# Patient Record
Sex: Female | Born: 1980 | Race: White | Hispanic: No | Marital: Single | Smoking: Former smoker
Health system: Southern US, Community
[De-identification: ages and names within clinical notes are randomized; demographics above are authoritative.]

## PROBLEM LIST (undated history)

## (undated) DIAGNOSIS — E282 Polycystic ovarian syndrome: Secondary | ICD-10-CM

## (undated) DIAGNOSIS — K5190 Ulcerative colitis, unspecified, without complications: Secondary | ICD-10-CM

## (undated) DIAGNOSIS — E242 Drug-induced Cushing's syndrome: Secondary | ICD-10-CM

## (undated) DIAGNOSIS — F32A Depression, unspecified: Secondary | ICD-10-CM

## (undated) DIAGNOSIS — F329 Major depressive disorder, single episode, unspecified: Secondary | ICD-10-CM

## (undated) DIAGNOSIS — I10 Essential (primary) hypertension: Secondary | ICD-10-CM

## (undated) DIAGNOSIS — I499 Cardiac arrhythmia, unspecified: Secondary | ICD-10-CM

## (undated) DIAGNOSIS — IMO0001 Reserved for inherently not codable concepts without codable children: Secondary | ICD-10-CM

## (undated) DIAGNOSIS — G4733 Obstructive sleep apnea (adult) (pediatric): Secondary | ICD-10-CM

## (undated) HISTORY — DX: Major depressive disorder, single episode, unspecified: F32.9

## (undated) HISTORY — DX: Depression, unspecified: F32.A

## (undated) HISTORY — DX: Drug-induced Cushing's syndrome: E24.2

## (undated) HISTORY — PX: WISDOM TOOTH EXTRACTION: SHX21

## (undated) HISTORY — DX: Reserved for inherently not codable concepts without codable children: IMO0001

## (undated) HISTORY — DX: Obstructive sleep apnea (adult) (pediatric): G47.33

---

## 2005-12-09 ENCOUNTER — Emergency Department (HOSPITAL_COMMUNITY): Admission: EM | Admit: 2005-12-09 | Discharge: 2005-12-09 | Payer: Self-pay | Admitting: Emergency Medicine

## 2011-07-20 ENCOUNTER — Inpatient Hospital Stay (INDEPENDENT_AMBULATORY_CARE_PROVIDER_SITE_OTHER)
Admission: RE | Admit: 2011-07-20 | Discharge: 2011-07-20 | Disposition: A | Discharge status: Home / Self Care | Payer: Self-pay | Source: Ambulatory Visit | Attending: Family Medicine | Admitting: Family Medicine

## 2011-07-20 DIAGNOSIS — N390 Urinary tract infection, site not specified: Secondary | ICD-10-CM

## 2011-07-20 LAB — POCT URINALYSIS DIP (DEVICE)
Glucose, UA: NEGATIVE mg/dL
Nitrite: NEGATIVE
Urobilinogen, UA: 0.2 mg/dL (ref 0.0–1.0)

## 2011-11-06 ENCOUNTER — Other Ambulatory Visit: Payer: Self-pay

## 2011-11-06 ENCOUNTER — Emergency Department (INDEPENDENT_AMBULATORY_CARE_PROVIDER_SITE_OTHER)
Admission: EM | Admit: 2011-11-06 | Discharge: 2011-11-06 | Disposition: A | Discharge status: Home / Self Care | Payer: Self-pay | Source: Home / Self Care | Attending: Family Medicine | Admitting: Family Medicine

## 2011-11-06 DIAGNOSIS — R002 Palpitations: Secondary | ICD-10-CM

## 2011-11-06 LAB — POCT I-STAT, CHEM 8
Potassium: 3.6 mEq/L (ref 3.5–5.1)
Sodium: 142 mEq/L (ref 135–145)

## 2011-11-06 NOTE — ED Provider Notes (Signed)
History     CSN: [id number] Arrival date & time: 11/06/2011  1:39 PM   First MD Initiated Contact with Patient 11/06/11 1343      Chief Complaint  Patient presents with  . Tachycardia    (Consider location/radiation/quality/duration/timing/severity/associated sxs/prior treatment) Patient is a 30 y.o. female presenting with palpitations.  Palpitations  This is a new problem. The current episode started more than 1 week ago. The problem occurs every several days. 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 Pertinent negatives include no fever, no chest pain, no chest pressure, no near-syncope, no dizziness and no weakness. Associated symptoms comments: Does freq [employer] drinks.. Risk factors include stress.    History reviewed. No pertinent past medical history.  History reviewed. No pertinent past surgical history.  History reviewed. No pertinent family history.  History  Substance Use Topics  . Smoking status: Current Everyday Smoker  . Smokeless tobacco: Not on file  . Alcohol Use: Yes    OB History    Grav Para Term Preterm Abortions TAB SAB Ect Mult Living                  Review of Systems  Constitutional: Negative.  Negative for fever.  HENT: Negative.   Respiratory: Negative.   Cardiovascular: Positive for palpitations. Negative for chest pain and near-syncope.  Neurological: Negative for dizziness and weakness.    Allergies  Chocolate; Latex; and Nickel  Home Medications  No current outpatient prescriptions on file.  BP 136/84  Pulse 77  Temp(Src) 98.8 F (37.1 C) (Oral)  Resp 12  SpO2 100%  Physical Exam  Nursing note and vitals reviewed. Constitutional: She appears well-developed and well-nourished.  HENT:  Head: Normocephalic.  Right Ear: External ear normal.  Left Ear: External ear normal.  Neck: Normal range of motion. Neck supple.    Cardiovascular: Normal rate, regular rhythm, normal heart sounds and intact distal pulses.   Pulmonary/Chest: Effort normal and breath sounds normal.  Musculoskeletal: She exhibits no edema.  Skin: Skin is warm and dry.    ED Course  Procedures (including critical care time)  Labs Reviewed  POCT I-STAT, CHEM 8 - Abnormal; Notable for the following:    Glucose, Bld 105 (*)    All other components within normal limits  I-STAT, CHEM 8   No results found.   1. Palpitations       MDM  ekg--wnl., i-stat wnl.        Barkley Bruns, MD 11/06/11 [phone]

## 2011-11-06 NOTE — ED Notes (Signed)
Reportedly has been having episodes of rapid HR for past couple of weeks, heart rate speeds up and slows down; sometimes her heart "stops" or skips ; feels as if her heart is going to "explode'; denies pain otherwise

## 2011-12-12 ENCOUNTER — Inpatient Hospital Stay (HOSPITAL_COMMUNITY): Payer: Self-pay

## 2011-12-12 ENCOUNTER — Inpatient Hospital Stay (HOSPITAL_COMMUNITY)
Admission: AD | Admit: 2011-12-12 | Discharge: 2011-12-12 | Disposition: A | Discharge status: Home / Self Care | Payer: Self-pay | Source: Ambulatory Visit | Attending: Obstetrics & Gynecology | Admitting: Obstetrics & Gynecology

## 2011-12-12 ENCOUNTER — Encounter (HOSPITAL_COMMUNITY): Payer: Self-pay | Admitting: *Deleted

## 2011-12-12 DIAGNOSIS — N939 Abnormal uterine and vaginal bleeding, unspecified: Secondary | ICD-10-CM

## 2011-12-12 DIAGNOSIS — N938 Other specified abnormal uterine and vaginal bleeding: Secondary | ICD-10-CM | POA: Insufficient documentation

## 2011-12-12 DIAGNOSIS — N949 Unspecified condition associated with female genital organs and menstrual cycle: Secondary | ICD-10-CM | POA: Insufficient documentation

## 2011-12-12 DIAGNOSIS — N898 Other specified noninflammatory disorders of vagina: Secondary | ICD-10-CM

## 2011-12-12 HISTORY — DX: Cardiac arrhythmia, unspecified: I49.9

## 2011-12-12 LAB — WET PREP, GENITAL
Clue Cells Wet Prep HPF POC: NONE SEEN
Trich, Wet Prep: NONE SEEN
Yeast Wet Prep HPF POC: NONE SEEN

## 2011-12-12 LAB — URINALYSIS, ROUTINE W REFLEX MICROSCOPIC
Glucose, UA: NEGATIVE mg/dL
Specific Gravity, Urine: >1.03 — ABNORMAL HIGH (ref 1.005–1.030)

## 2011-12-12 LAB — URINE MICROSCOPIC-ADD ON

## 2011-12-12 LAB — CBC
HCT: 34.8 % — ABNORMAL LOW (ref 36.0–46.0)
MCV: 86.4 fL (ref 78.0–100.0)
RBC: 4.03 MIL/uL (ref 3.87–5.11)
RDW: 11.9 % (ref 11.5–15.5)
WBC: 7 10*3/uL (ref 4.0–10.5)

## 2011-12-12 NOTE — ED Provider Notes (Signed)
History   Patient is a 31 year old G0P0 who presents with chief complaint of heavy vaginal bleeding, vaginal pruritus, and lower abdominal and low back pain for the past 13 days.  She states that her vaginal bleeding was irregular and heavy, filling 8 tampons per day.  Her LMP was December 14th and she menstruates regularly every 30 days.  She has no previous history of irregular vaginal bleeding.  She is not currently bleeding today.  Patient also reports vaginal pruritus that has been intermittent over the past 13 days and lower abdominal/low back pain that has been persistent for the past 13 days, as well as painful vaginal intercourse.  States that she had 3 UTI's between August and November 2012.  She has never had a pap smear and has no previous history of STI's.  Reports tactile fever, fatigue, and headache 1 day ago. Denies vision change, shortness of breath, chest pain, vomiting, diarrhea, blood in stool, leg cramping.  Quit smoking 11/28/2011 and takes a multivitamin daily.     Chief Complaint  Patient presents with  . Abdominal Cramping  . Back Pain   HPI  Pertinent Gynecological History: Menses: usually lasting less than 6 days and with minimal cramping Bleeding: intermenstrual bleeding Contraception: vasectomy DES exposure: unknown Blood transfusions: none Sexually transmitted diseases: no past history Previous GYN Procedures: n/a Last mammogram: n/a Last pap: Never had a pap smear.  Past Medical History  Diagnosis Date  . Irregular heart beat     Past Surgical History  Procedure Date  . Wisdom tooth extraction     Family History  Problem Relation Age of Onset  . Anesthesia problems Neg Hx     History  Substance Use Topics  . Smoking status: Former Smoker    Quit date: 11/28/2011  . Smokeless tobacco: Never Used  . Alcohol Use: Yes     occas., once a month or less.    Allergies:  Allergies  Allergen Reactions  . Chocolate Swelling  . Latex Itching and  Swelling  . Nickel Swelling    Prescriptions prior to admission  Medication Sig Dispense Refill  . acetaminophen (TYLENOL) 500 MG tablet Take 1,000 mg by mouth every 6 (six) hours as needed. Takes for headache      . Multiple Vitamin (MULITIVITAMIN WITH MINERALS) TABS Take 1 tablet by mouth daily.        Review of Systems  Constitutional: Positive for fever (tactile) and malaise/fatigue.  Eyes: Negative for blurred vision and double vision.  Respiratory: Negative for shortness of breath.   Cardiovascular: Negative for chest pain and leg swelling.  Gastrointestinal: Positive for nausea and abdominal pain. Negative for diarrhea and blood in stool.  Genitourinary: Positive for hematuria. Negative for dysuria.  Neurological: Positive for headaches. Negative for loss of consciousness.   Physical Exam   Blood pressure 135/84, pulse 108, temperature 99 F (37.2 C), temperature source Oral, resp. rate 16, height 5' 4.5" (1.638 m), weight 69.854 kg (154 lb), last menstrual period 11/29/2011, SpO2 99.00%.  Physical Exam  Constitutional: She is oriented to person, place, and time. She appears well-developed and well-nourished.  HENT:  Head: Normocephalic and atraumatic.  Eyes: Conjunctivae are normal. Pupils are equal, round, and reactive to light.  Cardiovascular: Regular rhythm, normal heart sounds and intact distal pulses.   No murmur heard. Respiratory: Effort normal and breath sounds normal. No respiratory distress. She has no wheezes. She has no rales.  GI: Bowel sounds are normal. There is tenderness (  periumbilical and RLQ). There is no rebound and no guarding.  Genitourinary: Vagina normal and uterus normal. There is no rash, lesion or injury on the right labia. There is no rash, lesion or injury on the left labia. Cervix exhibits no discharge. Right adnexum displays tenderness (mild to moderate). Right adnexum displays no mass and no fullness. Left adnexum displays tenderness (mild to  moderate). Left adnexum displays no mass and no fullness. No bleeding around the vagina. No vaginal discharge found.  Neurological: She is alert and oriented to person, place, and time.  Skin: Skin is warm and dry.  Psychiatric: She has a normal mood and affect. Her behavior is normal.   Results for orders placed during the hospital encounter of 12/12/11 (from the past 24 hour(s))  URINALYSIS, ROUTINE W REFLEX MICROSCOPIC     Status: Abnormal   Collection Time   12/12/11 10:55 AM      Component Value Range   Color, Urine YELLOW  YELLOW    APPearance HAZY (*) CLEAR    Specific Gravity, Urine >1.030 (*) 1.005 - 1.030    pH 6.0  5.0 - 8.0    Glucose, UA NEGATIVE  NEGATIVE (mg/dL)   Hgb urine dipstick SMALL (*) NEGATIVE    Bilirubin Urine NEGATIVE  NEGATIVE    Ketones, ur NEGATIVE  NEGATIVE (mg/dL)   Protein, ur NEGATIVE  NEGATIVE (mg/dL)   Urobilinogen, UA 0.2  0.0 - 1.0 (mg/dL)   Nitrite NEGATIVE  NEGATIVE    Leukocytes, UA LARGE (*) NEGATIVE   URINE MICROSCOPIC-ADD ON     Status: Normal   Collection Time   12/12/11 10:55 AM      Component Value Range   Squamous Epithelial / LPF RARE  RARE    WBC, UA TOO NUMEROUS TO COUNT  <3 (WBC/hpf)   RBC / HPF 3-6  <3 (RBC/hpf)  POCT PREGNANCY, URINE     Status: Normal   Collection Time   12/12/11 11:07 AM      Component Value Range   Preg Test, Ur NEGATIVE    WET PREP, GENITAL     Status: Abnormal   Collection Time   12/12/11 11:40 AM      Component Value Range   Yeast, Wet Prep NONE SEEN  NONE SEEN    Trich, Wet Prep NONE SEEN  NONE SEEN    Clue Cells, Wet Prep NONE SEEN  NONE SEEN    WBC, Wet Prep HPF POC MODERATE (*) NONE SEEN   CBC     Status: Abnormal   Collection Time   12/12/11 12:10 PM      Component Value Range   WBC 7.0  4.0 - 10.5 (K/uL)   RBC 4.03  3.87 - 5.11 (MIL/uL)   Hemoglobin 11.6 (*) 12.0 - 15.0 (g/dL)   HCT 16.1 (*) 09.6 - 46.0 (%)   MCV 86.4  78.0 - 100.0 (fL)   MCH 28.8  26.0 - 34.0 (pg)   MCHC 33.3  30.0 - 36.0  (g/dL)   RDW 04.5  40.9 - 81.1 (%)   Platelets 269  150 - 400 (K/uL)    *RADIOLOGY REPORT*  Clinical Data: Pelvic pain  TRANSABDOMINAL AND TRANSVAGINAL ULTRASOUND OF PELVIS  Technique: Both transabdominal and transvaginal ultrasound  examinations of the pelvis were performed. Transabdominal technique  was performed for global imaging of the pelvis including uterus,  ovaries, adnexal regions, and pelvic cul-de-sac.  Comparison: None.  It was necessary to proceed with endovaginal exam following the  transabdominal exam to visualize the endometrium and adnexa.  Findings:  The uterus is normal in size, measuring 6.6 x 3.2 x 4.5 cm. The  endometrial stripe is thin and homogeneous, measuring 5 mm in  width. No focal fibroids are identified.  Both ovaries are normal in size and appearance. The right ovary  measures 3.0 x 2.5 x 2.6 cm, and the left ovary measures 4.8 x 4.1  x 2.7 cm. Physiologic follicles are present on both ovaries.  There is a moderate amount of complex free pelvic fluid. This  could represent bleeding or infection. No dilated fallopian tubes  are identified today.  IMPRESSION:  Normal appearing uterus and ovaries bilaterally.  There is a moderate amount of complex fluid within the pelvis which  most likely represents physiologic mid cycle bleeding from  ovulation or infection. Please correlate clinically regarding any  other symptoms of pelvic inflammatory disease.  Original Report Authenticated By: Brandon Melnick, M.D.  MAU Course  Procedures Urinalysis UPT Gonorrhea and chlamydia Wet prep Pelvic ultrasound   MDM  I have reviewed the patient's history, observed the student's physical exam and discussed plan of care with him.  I agree with his findings. Assessment and Plan  Assessment 1) Abnormal vaginal bleeding Plan 1) Reviewed labs and ultrasound with patient.  If symptoms continue she will need evaluation with the doctor of her choice.    Derek Jack 12/12/2011, 11:47 AM    Matt Holmes, NP 12/12/11 1439  Matt Holmes, NP 12/12/11 1444

## 2011-12-12 NOTE — Progress Notes (Signed)
Patient states she has had irregular bleeding. Last period was 1-2. Has had recurrent UTI's since September and yeast infections.

## 2011-12-13 LAB — GC/CHLAMYDIA PROBE AMP, GENITAL: GC Probe Amp, Genital: NEGATIVE

## 2012-01-25 ENCOUNTER — Encounter: Payer: Self-pay | Admitting: Obstetrics & Gynecology

## 2013-03-18 ENCOUNTER — Other Ambulatory Visit: Payer: Self-pay | Admitting: Gastroenterology

## 2013-03-18 DIAGNOSIS — R1011 Right upper quadrant pain: Secondary | ICD-10-CM

## 2013-03-19 ENCOUNTER — Ambulatory Visit
Admission: RE | Admit: 2013-03-19 | Discharge: 2013-03-19 | Disposition: A | Discharge status: Home / Self Care | Payer: BC Managed Care – PPO | Source: Ambulatory Visit | Attending: Gastroenterology | Admitting: Gastroenterology

## 2013-03-19 DIAGNOSIS — R1011 Right upper quadrant pain: Secondary | ICD-10-CM

## 2013-12-18 ENCOUNTER — Other Ambulatory Visit: Payer: Self-pay | Admitting: Gastroenterology

## 2013-12-18 ENCOUNTER — Ambulatory Visit
Admission: RE | Admit: 2013-12-18 | Discharge: 2013-12-18 | Disposition: A | Discharge status: Home / Self Care | Payer: BC Managed Care – PPO | Source: Ambulatory Visit | Attending: Gastroenterology | Admitting: Gastroenterology

## 2013-12-18 DIAGNOSIS — R109 Unspecified abdominal pain: Secondary | ICD-10-CM

## 2013-12-18 DIAGNOSIS — K5190 Ulcerative colitis, unspecified, without complications: Secondary | ICD-10-CM

## 2013-12-18 MED ORDER — IOHEXOL 300 MG/ML  SOLN
30.0000 mL | Freq: Once | INTRAMUSCULAR | Status: AC | PRN
  Administered 2013-12-18: 30 mL via ORAL

## 2013-12-18 MED ORDER — IOHEXOL 300 MG/ML  SOLN
100.0000 mL | Freq: Once | INTRAMUSCULAR | Status: AC | PRN
  Administered 2013-12-18: 100 mL via INTRAVENOUS

## 2013-12-20 ENCOUNTER — Inpatient Hospital Stay (HOSPITAL_COMMUNITY)
Admission: EM | Admit: 2013-12-20 | Discharge: 2013-12-23 | DRG: 387 | Disposition: A | Discharge status: Home / Self Care | Payer: BC Managed Care – PPO | Attending: Family Medicine | Admitting: Family Medicine

## 2013-12-20 ENCOUNTER — Encounter (HOSPITAL_COMMUNITY): Payer: Self-pay | Admitting: Emergency Medicine

## 2013-12-20 DIAGNOSIS — K5190 Ulcerative colitis, unspecified, without complications: Principal | ICD-10-CM | POA: Diagnosis present

## 2013-12-20 DIAGNOSIS — R002 Palpitations: Secondary | ICD-10-CM | POA: Diagnosis present

## 2013-12-20 DIAGNOSIS — Z79899 Other long term (current) drug therapy: Secondary | ICD-10-CM

## 2013-12-20 DIAGNOSIS — Z87891 Personal history of nicotine dependence: Secondary | ICD-10-CM

## 2013-12-20 DIAGNOSIS — Z23 Encounter for immunization: Secondary | ICD-10-CM

## 2013-12-20 DIAGNOSIS — Z833 Family history of diabetes mellitus: Secondary | ICD-10-CM

## 2013-12-20 DIAGNOSIS — K859 Acute pancreatitis without necrosis or infection, unspecified: Secondary | ICD-10-CM

## 2013-12-20 DIAGNOSIS — R7309 Other abnormal glucose: Secondary | ICD-10-CM | POA: Diagnosis present

## 2013-12-20 DIAGNOSIS — E282 Polycystic ovarian syndrome: Secondary | ICD-10-CM | POA: Diagnosis present

## 2013-12-20 DIAGNOSIS — R748 Abnormal levels of other serum enzymes: Secondary | ICD-10-CM | POA: Diagnosis present

## 2013-12-20 HISTORY — DX: Polycystic ovarian syndrome: E28.2

## 2013-12-20 HISTORY — DX: Ulcerative colitis, unspecified, without complications: K51.90

## 2013-12-20 LAB — CBC WITH DIFFERENTIAL/PLATELET
BASOS ABS: 0 10*3/uL (ref 0.0–0.1)
Basophils Relative: 0 % (ref 0–1)
Eosinophils Absolute: 0.4 10*3/uL (ref 0.0–0.7)
Eosinophils Relative: 4 % (ref 0–5)
HEMATOCRIT: 32.9 % — AB (ref 36.0–46.0)
Hemoglobin: 11.3 g/dL — ABNORMAL LOW (ref 12.0–15.0)
LYMPHS PCT: 17 % (ref 12–46)
Lymphs Abs: 1.5 10*3/uL (ref 0.7–4.0)
MCH: 27.9 pg (ref 26.0–34.0)
MCHC: 34.3 g/dL (ref 30.0–36.0)
MCV: 81.2 fL (ref 78.0–100.0)
MONO ABS: 0.9 10*3/uL (ref 0.1–1.0)
Monocytes Relative: 10 % (ref 3–12)
NEUTROS ABS: 6.1 10*3/uL (ref 1.7–7.7)
Neutrophils Relative %: 69 % (ref 43–77)
PLATELETS: 309 10*3/uL (ref 150–400)
RBC: 4.05 MIL/uL (ref 3.87–5.11)
RDW: 12.8 % (ref 11.5–15.5)
WBC: 8.9 10*3/uL (ref 4.0–10.5)

## 2013-12-20 LAB — COMPREHENSIVE METABOLIC PANEL
ALT: 5 U/L (ref 0–35)
AST: 11 U/L (ref 0–37)
Albumin: 3.2 g/dL — ABNORMAL LOW (ref 3.5–5.2)
Alkaline Phosphatase: 66 U/L (ref 39–117)
BUN: 4 mg/dL — ABNORMAL LOW (ref 6–23)
CHLORIDE: 98 meq/L (ref 96–112)
CO2: 23 mEq/L (ref 19–32)
Calcium: 9.1 mg/dL (ref 8.4–10.5)
Creatinine, Ser: 0.67 mg/dL (ref 0.50–1.10)
GFR calc non Af Amer: >90 mL/min (ref >90)
Glucose, Bld: 79 mg/dL (ref 70–99)
Potassium: 4 mEq/L (ref 3.7–5.3)
SODIUM: 138 meq/L (ref 137–147)
Total Protein: 7.7 g/dL (ref 6.0–8.3)

## 2013-12-20 LAB — URINALYSIS, ROUTINE W REFLEX MICROSCOPIC
BILIRUBIN URINE: NEGATIVE
GLUCOSE, UA: NEGATIVE mg/dL
Hgb urine dipstick: NEGATIVE
KETONES UR: NEGATIVE mg/dL
Leukocytes, UA: NEGATIVE
Nitrite: NEGATIVE
Protein, ur: NEGATIVE mg/dL
Specific Gravity, Urine: 1.014 (ref 1.005–1.030)
Urobilinogen, UA: 0.2 mg/dL (ref 0.0–1.0)
pH: 6.5 (ref 5.0–8.0)

## 2013-12-20 LAB — GLUCOSE, CAPILLARY: GLUCOSE-CAPILLARY: 248 mg/dL — AB (ref 70–99)

## 2013-12-20 LAB — OCCULT BLOOD, POC DEVICE: Fecal Occult Bld: POSITIVE — AB

## 2013-12-20 LAB — AMYLASE: Amylase: 292 U/L — ABNORMAL HIGH (ref 0–105)

## 2013-12-20 LAB — PHOSPHORUS: Phosphorus: 2.3 mg/dL (ref 2.3–4.6)

## 2013-12-20 LAB — LIPASE, BLOOD: Lipase: 1005 U/L — ABNORMAL HIGH (ref 11–59)

## 2013-12-20 LAB — MAGNESIUM: MAGNESIUM: 1.8 mg/dL (ref 1.5–2.5)

## 2013-12-20 MED ORDER — METHYLPREDNISOLONE SODIUM SUCC 125 MG IJ SOLR
80.0000 mg | Freq: Three times a day (TID) | INTRAMUSCULAR | Status: DC
  Administered 2013-12-20 – 2013-12-22 (×5): 80 mg via INTRAVENOUS
  Filled 2013-12-20 (×8): qty 1.28

## 2013-12-20 MED ORDER — HEPARIN SODIUM (PORCINE) 5000 UNIT/ML IJ SOLN
5000.0000 [IU] | Freq: Three times a day (TID) | INTRAMUSCULAR | Status: DC
  Administered 2013-12-20 – 2013-12-23 (×9): 5000 [IU] via SUBCUTANEOUS
  Filled 2013-12-20 (×11): qty 1

## 2013-12-20 MED ORDER — ONDANSETRON HCL 4 MG/2ML IJ SOLN
4.0000 mg | Freq: Once | INTRAMUSCULAR | Status: AC
  Administered 2013-12-20: 4 mg via INTRAVENOUS
  Filled 2013-12-20: qty 2

## 2013-12-20 MED ORDER — METHYLPREDNISOLONE SODIUM SUCC 125 MG IJ SOLR
125.0000 mg | Freq: Once | INTRAMUSCULAR | Status: AC
  Administered 2013-12-20: 125 mg via INTRAVENOUS
  Filled 2013-12-20: qty 2

## 2013-12-20 MED ORDER — MORPHINE SULFATE 2 MG/ML IJ SOLN
1.0000 mg | INTRAMUSCULAR | Status: DC | PRN
  Administered 2013-12-20 – 2013-12-22 (×7): 1 mg via INTRAVENOUS
  Filled 2013-12-20 (×8): qty 1

## 2013-12-20 MED ORDER — HYDROMORPHONE HCL PF 1 MG/ML IJ SOLN
1.0000 mg | Freq: Once | INTRAMUSCULAR | Status: AC
  Administered 2013-12-20: 1 mg via INTRAVENOUS
  Filled 2013-12-20: qty 1

## 2013-12-20 MED ORDER — SODIUM CHLORIDE 0.9 % IV BOLUS (SEPSIS)
1000.0000 mL | Freq: Once | INTRAVENOUS | Status: AC
  Administered 2013-12-20: 1000 mL via INTRAVENOUS

## 2013-12-20 MED ORDER — CYCLOBENZAPRINE HCL 5 MG PO TABS
5.0000 mg | ORAL_TABLET | Freq: Every evening | ORAL | Status: DC | PRN
  Filled 2013-12-20: qty 2

## 2013-12-20 MED ORDER — ONDANSETRON HCL 4 MG PO TABS: 4.0000 mg | ORAL_TABLET | Freq: Four times a day (QID) | ORAL | Status: DC | PRN

## 2013-12-20 MED ORDER — LORATADINE 10 MG PO TABS
10.0000 mg | ORAL_TABLET | Freq: Every day | ORAL | Status: DC
  Administered 2013-12-20 – 2013-12-23 (×4): 10 mg via ORAL
  Filled 2013-12-20 (×6): qty 1

## 2013-12-20 MED ORDER — NORGESTIM-ETH ESTRAD TRIPHASIC 0.18/0.215/0.25 MG-35 MCG PO TABS
1.0000 | ORAL_TABLET | Freq: Every day | ORAL | Status: DC
  Administered 2013-12-20 – 2013-12-22 (×3): 1 via ORAL

## 2013-12-20 MED ORDER — INSULIN ASPART 100 UNIT/ML ~~LOC~~ SOLN
0.0000 [IU] | Freq: Three times a day (TID) | SUBCUTANEOUS | Status: DC
  Administered 2013-12-21 (×3): 2 [IU] via SUBCUTANEOUS
  Administered 2013-12-22 (×2): 1 [IU] via SUBCUTANEOUS
  Administered 2013-12-22: 2 [IU] via SUBCUTANEOUS
  Administered 2013-12-23: 5 [IU] via SUBCUTANEOUS

## 2013-12-20 MED ORDER — KCL IN DEXTROSE-NACL 20-5-0.45 MEQ/L-%-% IV SOLN
INTRAVENOUS | Status: DC
  Administered 2013-12-20: 75 mL via INTRAVENOUS
  Administered 2013-12-21 – 2013-12-22 (×4): via INTRAVENOUS
  Filled 2013-12-20 (×6): qty 1000

## 2013-12-20 MED ORDER — ONDANSETRON HCL 4 MG/2ML IJ SOLN
4.0000 mg | Freq: Four times a day (QID) | INTRAMUSCULAR | Status: DC | PRN
  Administered 2013-12-20: 4 mg via INTRAVENOUS
  Filled 2013-12-20: qty 2

## 2013-12-20 NOTE — ED Notes (Signed)
Pt from home reports that pt was dx with ulcerative colitis 6 months ago. Pt c/o severe pain x1 month. Pt reports that she was taking Voltaren and pain has become worse. Pt states that she has bloody stools x1 months. Pt reports HBG was 11 on 1/22 and was told if she continued to have blood in stool, to go to ED. Pt denies vomiting. Pt is A&O and in NAD

## 2013-12-20 NOTE — H&P (Addendum)
[facility] History and Physical  Kathleen AdaJennifer S Zacarias ZOX:[id number] DOB: 01/18/1981 DOA: 12/20/2013  Referring physician: Dr. Loretha StaplerWofford PCP: Per Patient No Pcp   Chief Complaint: abdominal discomfort, diarrhea, with BRBPR  HPI: Kathleen AdaJennifer S Mcdonald is a 33 y.o. female  With history of ulcerative colitis diagnosed 6 months ago. Presents to the ED with the above complaints. Reports problem has been present most recently within the last month. And since then she has had having bloody stools and abdominal discomfort. [facility]. Patient also states that she has been warned to the touch and having temperatures in the low 100's. The tramadol has helped with the abdominal discomfort but given persistent symptoms patient presented to the ED for further evaluation and recommendations.  Patient had a CT of abdomen which was interpreted as colonic wall thickening involving the sigmoid colon/rectum and hepatic flexure, corresponding to known ulcerative colitis, no evidence of bowel obstruction. Also patient had elevated lipase levels while in the ED although CT reports normal pancreas   Review of Systems:  Constitutional:  No weight loss, night sweats, Fevers, chills, fatigue.  HEENT:  No headaches, Difficulty swallowing,Tooth/dental problems,Sore throat,  No sneezing, itching, ear ache, nasal congestion, post nasal drip,  Cardio-vascular:  No chest pain, Orthopnea, PND, swelling in lower extremities, anasarca, dizziness, palpitations  GI:  No heartburn, indigestion, + abdominal pain,+ nausea, denies vomiting, + diarrhea,+ loss of appetite  Resp:  No shortness of breath with exertion or at rest. No excess mucus, no productive cough, No non-productive cough, No coughing up of blood.No change in color of mucus.No wheezing.No chest wall deformity  Skin:  no rash or lesions.  GU:  no dysuria, change in color of urine, no urgency or frequency. No flank pain.  Musculoskeletal:  No  joint pain or swelling. No decreased range of motion. No back pain.  Psych:  No change in mood or affect. No depression or anxiety. No memory loss.   Past Medical History  Diagnosis Date  . Irregular heart beat   . Ulcerative colitis   . PCOS (polycystic ovarian syndrome)    Past Surgical History  Procedure Laterality Date  . Wisdom tooth extraction     Social History:  reports that she quit smoking about 2 years ago. She has never used smokeless tobacco. She reports that she drinks alcohol. She reports that she does not use illicit drugs.  Allergies  Allergen Reactions  . Chocolate Swelling  . Latex Itching and Swelling  . Nickel Swelling    Family History  Problem Relation Age of Onset  . Anesthesia problems Neg Hx      Prior to Admission medications   Medication Sig Start Date End Date Taking? Authorizing Provider  cetirizine (ZYRTEC) 10 MG tablet Take 10 mg by mouth at bedtime.   Yes Historical Provider, MD  cyclobenzaprine (FLEXERIL) 5 MG tablet Take 5-10 mg by mouth at bedtime as needed for muscle spasms.   Yes Historical Provider, MD  mesalamine (LIALDA) 1.2 G EC tablet Take 2.4 g by mouth 2 (two) times daily.   Yes Historical Provider, MD  metFORMIN (GLUCOPHAGE) 500 MG tablet Take 500 mg by mouth 2 (two) times daily with a meal.   Yes Historical Provider, MD  Norgestim-Eth Estrad Triphasic (TRINESSA, 28, PO) Take 1 tablet by mouth daily.   Yes Historical Provider, MD  spironolactone (ALDACTONE) 100 MG tablet Take 100 mg by mouth daily.   Yes Historical Provider, MD  traMADol Janean Sark(ULTRAM) 50  MG tablet Take 50 mg by mouth every 6 (six) hours as needed for moderate pain.   Yes Historical Provider, MD   Physical Exam: Filed Vitals:   12/20/13 1217  BP: 144/87  Pulse: 122  Temp: 97.7 F (36.5 C)  Resp: 17    BP 144/87  Pulse 122  Temp(Src) 97.7 F (36.5 C) (Oral)  Resp 17  SpO2 99%  LMP 11/24/2013  General:  Appears calm and comfortable Eyes: PERRL, normal  lids, irises & conjunctiva ENT: grossly normal hearing, lips & tongue Neck: no LAD, masses or thyromegaly Cardiovascular: RRR, no m/r/g. No LE edema. Telemetry: SR, no arrhythmias  Respiratory: CTA bilaterally, no w/r/r. Normal respiratory effort. Abdomen: soft, nondistended, no hepatomegaly Skin: no rash or induration seen on limited exam Musculoskeletal: grossly normal tone BUE/BLE Psychiatric: grossly normal mood and affect, speech fluent and appropriate Neurologic: grossly non-focal.          Labs on Admission:  Basic Metabolic Panel:  Recent Labs Lab 12/20/13 1355  NA 138  K 4.0  CL 98  CO2 23  GLUCOSE 79  BUN 4*  CREATININE 0.67  CALCIUM 9.1   Liver Function Tests:  Recent Labs Lab 12/20/13 1355  AST 11  ALT 5  ALKPHOS 66  BILITOT <0.2*  PROT 7.7  ALBUMIN 3.2*    Recent Labs Lab 12/20/13 1355  LIPASE 1005*   No results found for this basename: AMMONIA,  in the last 168 hours CBC:  Recent Labs Lab 12/20/13 1355  WBC 8.9  NEUTROABS 6.1  HGB 11.3*  HCT 32.9*  MCV 81.2  PLT 309   Cardiac Enzymes: No results found for this basename: CKTOTAL, CKMB, CKMBINDEX, TROPONINI,  in the last 168 hours  BNP (last 3 results) No results found for this basename: PROBNP,  in the last 8760 hours CBG: No results found for this basename: GLUCAP,  in the last 168 hours  Radiological Exams on Admission: No results found.   Assessment/Plan Active Problems:   Ulcerative colitis, acute - Case discussed with gastroenterologist, I have officially consult with gastroenterology and currently awaiting their recommendations. Per my discussion with the GI doctor they currently recommend holding mesalamine and spironolactone. Also GI recommends adding Solu-Medrol 60 mg every 8 hours and a clear liquid diet  DVT prophylaxis - Heparin  Addendum: Elevated lipase: Will reassess amylase levels and hold spironolactone as well as mesalamine. Reassess lipase levels next a.m.  Place on clear liquid diet and maintenance IV fluids  Code Status: full Family Communication: Discussed with patient and mother at bedside Disposition Plan: Pending improvement in condition and per GI recommendation  Time spent: > 35 minutes  Penny Pia [facility] Pager [phone]

## 2013-12-20 NOTE — ED Provider Notes (Signed)
CSN: [id number]     Arrival date & time 12/20/13  1204 History   First MD Initiated Contact with Patient 12/20/13 1315     Chief Complaint  Patient presents with  . Abdominal Pain  . Rectal Bleeding   (Consider location/radiation/quality/duration/timing/severity/associated sxs/prior Treatment) Patient is a 33 y.o. female presenting with abdominal pain.  Abdominal Pain Pain location:  LLQ and LUQ Pain quality: aching   Pain radiates to:  Does not radiate Pain severity:  Severe Onset quality:  Gradual Duration: several weeks, but significantly worse over last week. Timing:  Constant Progression:  Worsening Chronicity:  Recurrent Context comment:  Diagnosed with UC about 6 months ago Relieved by:  Nothing Worsened by:  NSAIDs Ineffective treatments: Tramadol. Associated symptoms: diarrhea, fever (low grade) and nausea   Associated symptoms: no vomiting   Diarrhea:    Quality:  Bloody and mucous   Past Medical History  Diagnosis Date  . Irregular heart beat   . Ulcerative colitis   . PCOS (polycystic ovarian syndrome)    Past Surgical History  Procedure Laterality Date  . Wisdom tooth extraction     Family History  Problem Relation Age of Onset  . Anesthesia problems Neg Hx    History  Substance Use Topics  . Smoking status: Former Smoker    Quit date: 11/28/2011  . Smokeless tobacco: Never Used  . Alcohol Use: Yes     Comment: occas., once a month or less.   OB History   Grav Para Term Preterm Abortions TAB SAB Ect Mult Living   0              Review of Systems  Constitutional: Positive for fever (low grade).  Gastrointestinal: Positive for nausea, abdominal pain and diarrhea. Negative for vomiting.    Allergies  Chocolate; Latex; and Nickel  Home Medications   Current Outpatient Rx  Name  Route  Sig  Dispense  Refill  . cetirizine (ZYRTEC) 10 MG tablet   Oral   Take 10 mg by mouth at bedtime.         . cyclobenzaprine (FLEXERIL) 5 MG tablet    Oral   Take 5-10 mg by mouth at bedtime as needed for muscle spasms.         . mesalamine (LIALDA) 1.2 G EC tablet   Oral   Take 2.4 g by mouth 2 (two) times daily.         . metFORMIN (GLUCOPHAGE) 500 MG tablet   Oral   Take 500 mg by mouth 2 (two) times daily with a meal.         . Norgestim-Eth Estrad Triphasic (TRINESSA, 28, PO)   Oral   Take 1 tablet by mouth daily.         Marland Kitchen. spironolactone (ALDACTONE) 100 MG tablet   Oral   Take 100 mg by mouth daily.         . traMADol (ULTRAM) 50 MG tablet   Oral   Take 50 mg by mouth every 6 (six) hours as needed for moderate pain.          BP 144/87  Pulse 122  Temp(Src) 97.7 F (36.5 C) (Oral)  Resp 17  SpO2 99%  LMP 11/24/2013 Physical Exam  Nursing note and vitals reviewed. Constitutional: She is oriented to person, place, and time. She appears well-developed and well-nourished. No distress.  Appears uncomfortable  HENT:  Head: Normocephalic and atraumatic.  Mouth/Throat: Oropharynx is clear and moist.  Eyes:  Conjunctivae are normal. Pupils are equal, round, and reactive to light. No scleral icterus.  Neck: Neck supple.  Cardiovascular: Normal rate, regular rhythm, normal heart sounds and intact distal pulses.   No murmur heard. Pulmonary/Chest: Effort normal and breath sounds normal. No stridor. No respiratory distress. She has no rales.  Abdominal: Soft. Bowel sounds are normal. She exhibits no distension. There is generalized tenderness. There is no rigidity, no rebound and no guarding.  TTP worse in Left hemiabdomen  Musculoskeletal: Normal range of motion. She exhibits no edema.  Neurological: She is alert and oriented to person, place, and time.  Skin: Skin is warm and dry. No rash noted.  Psychiatric: She has a normal mood and affect. Her behavior is normal.    ED Course  Procedures (including critical care time) Labs Review Labs Reviewed  CBC WITH DIFFERENTIAL - Abnormal; Notable for the  following:    Hemoglobin 11.3 (*)    HCT 32.9 (*)    All other components within normal limits  COMPREHENSIVE METABOLIC PANEL - Abnormal; Notable for the following:    BUN 4 (*)    Albumin 3.2 (*)    Total Bilirubin <0.2 (*)    All other components within normal limits  LIPASE, BLOOD - Abnormal; Notable for the following:    Lipase 1005 (*)    All other components within normal limits  URINALYSIS, ROUTINE W REFLEX MICROSCOPIC - Abnormal; Notable for the following:    APPearance CLOUDY (*)    All other components within normal limits  OCCULT BLOOD, POC DEVICE - Abnormal; Notable for the following:    Fecal Occult Bld POSITIVE (*)    All other components within normal limits  AMYLASE   Imaging Review  Ct Abdomen Pelvis W Contrast  12/18/2013   CLINICAL DATA:  Left abdominal pain, nausea, melena, diarrhea, fever. Ulcerative colitis.  EXAM: CT ABDOMEN AND PELVIS WITH CONTRAST  TECHNIQUE: Multidetector CT imaging of the abdomen and pelvis was performed using the standard protocol following bolus administration of intravenous contrast.  CONTRAST:  OMNIPAQUE IOHEXOL 300 MG/ML  SOLN  COMPARISON:  None.  FINDINGS: Lung bases are clear.  Liver is notable for focal fat/altered perfusion along the falciform ligament (series 2/ image 21).  Spleen, pancreas, and adrenal glands are within normal limits.  Gallbladder is unremarkable. No intrahepatic or extrahepatic ductal dilatation.  Kidneys are within normal limits.  No hydronephrosis.  No evidence of bowel obstruction. Normal appendix. Mild wall thickening involving the hepatic flexure of the colon (series 2/image 31). Mild wall thickening involving the sigmoid colon/rectum, likely related to known ulcerative colitis.  No drainable fluid collection/abscess.  No evidence of abdominal aortic aneurysm.  No abdominopelvic ascites.  Mildly prominent nodes in the jejunal mesentery and sigmoid mesial colon measuring up to 10 mm short axis (series 2/ images  34 and 57), likely reactive.  Uterus and right ovary are within normal limits. 4.5 x 3.9 cm left ovarian cyst/follicle (series 2/image 70), likely physiologic.  Bladder is within normal limits.  Visualized osseous structures are within normal limits.  IMPRESSION: Colonic wall thickening involving the sigmoid colon/rectum and hepatic flexure, corresponding to known ulcerative colitis.  No evidence of bowel obstruction. No drainable fluid collection/abscess.  4.5 cm left ovarian cyst/follicle, likely physiologic. Consider follow-up pelvic ultrasound in 6-10 weeks as clinically warranted.   Electronically Signed   By: Charline Bills M.D.   On: 12/18/2013 16:31    EKG Interpretation   None  EKG - NSR, rate 95, normal axis, normal intervals, no ST/T changes, no significant changes from prior.   MDM   1. Ulcerative colitis, acute   2. Pancreatitis    33 yo female with hx of UC presenting with abdominal pain and mucous/bloody diarrhea.  Had a CT yesterday which showed colonic wall thickening.  Labs today worrisome for elevated Lipase of unclear origin.  Plan for admission for IV hydration, pain control, monitoring of pancreatic enzymes.  Discussed case with Dr. Sharol Roussel [facility]) and with Dr. Cena Benton ([facility]  Plan for admission.  Initial recommendations from Dr. Sharol Roussel were Solumedrol 60mg  q8-12 hours, holding Lialda, IV hydration, and repeat pancreatic enzyme testing.      Candyce Churn, MD 12/20/13 [phone]

## 2013-12-21 ENCOUNTER — Encounter (HOSPITAL_COMMUNITY): Payer: Self-pay | Admitting: Gastroenterology

## 2013-12-21 LAB — BASIC METABOLIC PANEL
BUN: 4 mg/dL — ABNORMAL LOW (ref 6–23)
CALCIUM: 8.5 mg/dL (ref 8.4–10.5)
CO2: 22 mEq/L (ref 19–32)
Chloride: 101 mEq/L (ref 96–112)
Creatinine, Ser: 0.57 mg/dL (ref 0.50–1.10)
Glucose, Bld: 212 mg/dL — ABNORMAL HIGH (ref 70–99)
POTASSIUM: 4.1 meq/L (ref 3.7–5.3)
Sodium: 137 mEq/L (ref 137–147)

## 2013-12-21 LAB — CBC
HCT: 30.8 % — ABNORMAL LOW (ref 36.0–46.0)
Hemoglobin: 10.2 g/dL — ABNORMAL LOW (ref 12.0–15.0)
MCH: 27.3 pg (ref 26.0–34.0)
MCHC: 33.1 g/dL (ref 30.0–36.0)
MCV: 82.6 fL (ref 78.0–100.0)
PLATELETS: 300 10*3/uL (ref 150–400)
RBC: 3.73 MIL/uL — ABNORMAL LOW (ref 3.87–5.11)
RDW: 12.9 % (ref 11.5–15.5)
WBC: 4.8 10*3/uL (ref 4.0–10.5)

## 2013-12-21 LAB — AMYLASE: AMYLASE: 80 U/L (ref 0–105)

## 2013-12-21 LAB — LIPASE, BLOOD: LIPASE: 158 U/L — AB (ref 11–59)

## 2013-12-21 LAB — GLUCOSE, CAPILLARY
GLUCOSE-CAPILLARY: 191 mg/dL — AB (ref 70–99)
GLUCOSE-CAPILLARY: 173 mg/dL — AB (ref 70–99)
Glucose-Capillary: 193 mg/dL — ABNORMAL HIGH (ref 70–99)

## 2013-12-21 MED ORDER — INFLUENZA VAC SPLIT QUAD 0.5 ML IM SUSP
0.5000 mL | INTRAMUSCULAR | Status: DC
  Filled 2013-12-21 (×2): qty 0.5

## 2013-12-21 NOTE — Consult Note (Addendum)
Reason for Consult: Abdominal pain inflammatory bowel disease Referring Physician: ER physician  Kathleen Mcdonald is an 33 y.o. female.  HPI: Patient known to my partner and her case was discussed with her husband on both Friday and Saturday and her office chart was reviewed and we were going to start her on steroids over the phone for increased abdominal pain and bloody diarrhea after previous workup was reviewed however with increased pain she presented to the emergency room and her lipase was increased despite a normal CAT scan 2 days ago and an ultrasound in the past and she only drinks rarely and she is feeling much better today after IV steroids and clear liquids only and she says the pain medicine helps and she says her abdominal pain actually started before her colitis diagnoses and she was started on her metformin and Aldactone before that and we discussed how medicines can sometimes cause pancreatitis versus how her lipase is just up from the local colitis inflammation and her family history is negative for inflammatory bowel disease but diabetes and 1 relative who was an alcoholic pancreatitis and she has no other complaints and her Voltaren that she was given for a short time did make her stomach pain worse as well Past Medical History  Diagnosis Date  . Irregular heart beat   . Ulcerative colitis   . PCOS (polycystic ovarian syndrome)     Past Surgical History  Procedure Laterality Date  . Wisdom tooth extraction      Family History  Problem Relation Age of Onset  . Anesthesia problems Neg Hx     Social History:  reports that she quit smoking about 2 years ago. She has never used smokeless tobacco. She reports that she drinks alcohol. She reports that she does not use illicit drugs.  Allergies:  Allergies  Allergen Reactions  . Chocolate Swelling  . Latex Itching and Swelling  . Nickel Swelling    Medications: I have reviewed the patient's current medications.  Results  for orders placed during the hospital encounter of 12/20/13 (from the past 48 hour(s))  CBC WITH DIFFERENTIAL     Status: Abnormal   Collection Time    12/20/13  1:55 PM      Result Value Range   WBC 8.9  4.0 - 10.5 K/uL   RBC 4.05  3.87 - 5.11 MIL/uL   Hemoglobin 11.3 (*) 12.0 - 15.0 g/dL   HCT 32.9 (*) 36.0 - 46.0 %   MCV 81.2  78.0 - 100.0 fL   MCH 27.9  26.0 - 34.0 pg   MCHC 34.3  30.0 - 36.0 g/dL   RDW 12.8  11.5 - 15.5 %   Platelets 309  150 - 400 K/uL   Neutrophils Relative % 69  43 - 77 %   Neutro Abs 6.1  1.7 - 7.7 K/uL   Lymphocytes Relative 17  12 - 46 %   Lymphs Abs 1.5  0.7 - 4.0 K/uL   Monocytes Relative 10  3 - 12 %   Monocytes Absolute 0.9  0.1 - 1.0 K/uL   Eosinophils Relative 4  0 - 5 %   Eosinophils Absolute 0.4  0.0 - 0.7 K/uL   Basophils Relative 0  0 - 1 %   Basophils Absolute 0.0  0.0 - 0.1 K/uL  COMPREHENSIVE METABOLIC PANEL     Status: Abnormal   Collection Time    12/20/13  1:55 PM      Result Value Range  Sodium 138  137 - 147 mEq/L   Potassium 4.0  3.7 - 5.3 mEq/L   Chloride 98  96 - 112 mEq/L   CO2 23  19 - 32 mEq/L   Glucose, Bld 79  70 - 99 mg/dL   BUN 4 (*) 6 - 23 mg/dL   Creatinine, Ser 0.67  0.50 - 1.10 mg/dL   Calcium 9.1  8.4 - 10.5 mg/dL   Total Protein 7.7  6.0 - 8.3 g/dL   Albumin 3.2 (*) 3.5 - 5.2 g/dL   AST 11  0 - 37 U/L   ALT 5  0 - 35 U/L   Alkaline Phosphatase 66  39 - 117 U/L   Total Bilirubin <0.2 (*) 0.3 - 1.2 mg/dL   GFR calc non Af Amer >90  >90 mL/min   GFR calc Af Amer >90  >90 mL/min   Comment: (NOTE)     The eGFR has been calculated using the CKD EPI equation.     This calculation has not been validated in all clinical situations.     eGFR's persistently <90 mL/min signify possible Chronic Kidney     Disease.  LIPASE, BLOOD     Status: Abnormal   Collection Time    12/20/13  1:55 PM      Result Value Range   Lipase 1005 (*) 11 - 59 U/L  AMYLASE     Status: Abnormal   Collection Time    12/20/13  1:55 PM       Result Value Range   Amylase 292 (*) 0 - 105 U/L  MAGNESIUM     Status: None   Collection Time    12/20/13  1:55 PM      Result Value Range   Magnesium 1.8  1.5 - 2.5 mg/dL  PHOSPHORUS     Status: None   Collection Time    12/20/13  1:55 PM      Result Value Range   Phosphorus 2.3  2.3 - 4.6 mg/dL  OCCULT BLOOD, POC DEVICE     Status: Abnormal   Collection Time    12/20/13  3:58 PM      Result Value Range   Fecal Occult Bld POSITIVE (*) NEGATIVE  URINALYSIS, ROUTINE W REFLEX MICROSCOPIC     Status: Abnormal   Collection Time    12/20/13  4:16 PM      Result Value Range   Color, Urine YELLOW  YELLOW   APPearance CLOUDY (*) CLEAR   Specific Gravity, Urine 1.014  1.005 - 1.030   pH 6.5  5.0 - 8.0   Glucose, UA NEGATIVE  NEGATIVE mg/dL   Hgb urine dipstick NEGATIVE  NEGATIVE   Bilirubin Urine NEGATIVE  NEGATIVE   Ketones, ur NEGATIVE  NEGATIVE mg/dL   Protein, ur NEGATIVE  NEGATIVE mg/dL   Urobilinogen, UA 0.2  0.0 - 1.0 mg/dL   Nitrite NEGATIVE  NEGATIVE   Leukocytes, UA NEGATIVE  NEGATIVE   Comment: MICROSCOPIC NOT DONE ON URINES WITH NEGATIVE PROTEIN, BLOOD, LEUKOCYTES, NITRITE, OR GLUCOSE <1000 mg/dL.  GLUCOSE, CAPILLARY     Status: Abnormal   Collection Time    12/20/13  9:49 PM      Result Value Range   Glucose-Capillary 248 (*) 70 - 99 mg/dL  BASIC METABOLIC PANEL     Status: Abnormal   Collection Time    12/21/13  5:37 AM      Result Value Range   Sodium 137  137 - 147 mEq/L  Potassium 4.1  3.7 - 5.3 mEq/L   Chloride 101  96 - 112 mEq/L   CO2 22  19 - 32 mEq/L   Glucose, Bld 212 (*) 70 - 99 mg/dL   BUN 4 (*) 6 - 23 mg/dL   Creatinine, Ser 0.57  0.50 - 1.10 mg/dL   Calcium 8.5  8.4 - 10.5 mg/dL   GFR calc non Af Amer >90  >90 mL/min   GFR calc Af Amer >90  >90 mL/min   Comment: (NOTE)     The eGFR has been calculated using the CKD EPI equation.     This calculation has not been validated in all clinical situations.     eGFR's persistently <90 mL/min  signify possible Chronic Kidney     Disease.  CBC     Status: Abnormal   Collection Time    12/21/13  5:39 AM      Result Value Range   WBC 4.8  4.0 - 10.5 K/uL   RBC 3.73 (*) 3.87 - 5.11 MIL/uL   Hemoglobin 10.2 (*) 12.0 - 15.0 g/dL   HCT 30.8 (*) 36.0 - 46.0 %   MCV 82.6  78.0 - 100.0 fL   MCH 27.3  26.0 - 34.0 pg   MCHC 33.1  30.0 - 36.0 g/dL   RDW 12.9  11.5 - 15.5 %   Platelets 300  150 - 400 K/uL  GLUCOSE, CAPILLARY     Status: Abnormal   Collection Time    12/21/13  8:06 AM      Result Value Range   Glucose-Capillary 191 (*) 70 - 99 mg/dL    No results found.  ROS negative except above Blood pressure 101/53, pulse 67, temperature 97.6 F (36.4 C), temperature source Oral, resp. rate 16, height _0  (1.6 m), weight 74.844 kg (165 lb), last menstrual period 11/24/2013, SpO2 98.00%. Physical Exam vital signs stable afebrile no acute distress exam pertinent for her abdomen being soft nontender labs stable today unfortunately lipase and amylase were not ordered but I will ask the lab to draw them off the blood they already have Assessment/Plan: Inflammatory bowel disease doubt significant pancreatitis Plan: Will hold metformin and Aldactone for now and consider outpatient discussion with her gynecologist and possibly an endocrinology consult otherwise continue present management and if better tomorrow and labs are okay can advance diet and change her to prednisone by mouth and hopefully she'll be able to go home soon and my partner can see her in one week in the office-dr ganem and might consider an MRCP if the question of pancreas involvement continues and her case was discussed with both the ER physician and the hospital team and as an addendum amylase is normal and lipase  Almost normal Kathleen Mcdonald E 12/21/2013, 10:10 AM

## 2013-12-21 NOTE — Progress Notes (Signed)
[facility] PROGRESS NOTE  Kathleen Mcdonald [id number] DOB: 02/09/1981 DOA: 12/20/2013 PCP: Per Patient No Pcp  Assessment/Plan: Ulcerative colitis, acute  - Consult to GI. We will follow up with their recommendations. The patient improved. - With continued improvement we'll plan on advancing diet  Elevated lipase - Continue bowel rest. Repeated lipase level much improved - CT of abdomen recently done did not report any pancreatitis.  DVT prophylaxis  - Heparin  Code Status: full Family Communication: Discussed with patient and family member at bedside Disposition Plan: Pending continued improvement in condition.   Consultants:  Gastroenterology  Procedures:  CT of abdomen and pelvis  Antibiotics:  None  HPI/Subjective: Patient feels much improved. No new complaints overnight  Objective: Filed Vitals:   12/21/13 1400  BP: 137/74  Pulse: 78  Temp: 97.9 F (36.6 C)  Resp: 18    Intake/Output Summary (Last 24 hours) at 12/21/13 1643 Last data filed at 12/21/13 1400  Gross per 24 hour  Intake 1466.25 ml  Output    300 ml  Net 1166.25 ml   Filed Weights   12/20/13 2244  Weight: 74.844 kg (165 lb)    Exam:   General:  Pt in NAD, Alert and awake  Cardiovascular: RRR, no murmurs, rubs  Respiratory: CTA BL, no wheezes  Abdomen: soft, ND, + Bowel sounds  Musculoskeletal: no cyanosis or clubbing   Data Reviewed: Basic Metabolic Panel:  Recent Labs Lab 12/20/13 1355 12/21/13 0537  NA 138 137  K 4.0 4.1  CL 98 101  CO2 23 22  GLUCOSE 79 212*  BUN 4* 4*  CREATININE 0.67 0.57  CALCIUM 9.1 8.5  MG 1.8  --   PHOS 2.3  --    Liver Function Tests:  Recent Labs Lab 12/20/13 1355  AST 11  ALT 5  ALKPHOS 66  BILITOT <0.2*  PROT 7.7  ALBUMIN 3.2*    Recent Labs Lab 12/20/13 1355 12/21/13 0530  LIPASE 1005* 158*  AMYLASE 292* 80   No results found for this basename: AMMONIA,  in the last 168 hours CBC:  Recent Labs Lab  12/20/13 1355 12/21/13 0539  WBC 8.9 4.8  NEUTROABS 6.1  --   HGB 11.3* 10.2*  HCT 32.9* 30.8*  MCV 81.2 82.6  PLT 309 300   Cardiac Enzymes: No results found for this basename: CKTOTAL, CKMB, CKMBINDEX, TROPONINI,  in the last 168 hours BNP (last 3 results) No results found for this basename: PROBNP,  in the last 8760 hours CBG:  Recent Labs Lab 12/20/13 2149 12/21/13 0806  GLUCAP 248* 191*    No results found for this or any previous visit (from the past 240 hour(s)).   Studies: No results found.  Scheduled Meds: . heparin  5,000 Units Subcutaneous Q8H  . insulin aspart  0-9 Units Subcutaneous TID WC  . loratadine  10 mg Oral Daily  . methylPREDNISolone (SOLU-MEDROL) injection  80 mg Intravenous Q8H  . Norgestimate-Ethinyl Estradiol Triphasic  1 tablet Oral QHS   Continuous Infusions: . dextrose 5 % and 0.45 % NaCl with KCl 20 mEq/L 75 mL/hr at 12/21/13 [id number]    Active Problems:   Ulcerative colitis, acute   Acute ulcerative colitis   Elevated lipase    Time spent: > 35 minutes    Penny PiaVEGA, Rozalyn Osland  [facility] Pager [phone]. If 7PM-7AM, please contact night-coverage at [email or web address], password [email or web address] 12/21/2013, 4:43 PM  LOS: 1 day

## 2013-12-22 LAB — CBC WITH DIFFERENTIAL/PLATELET
BASOS ABS: 0 10*3/uL (ref 0.0–0.1)
BASOS PCT: 0 % (ref 0–1)
Eosinophils Absolute: 0 10*3/uL (ref 0.0–0.7)
Eosinophils Relative: 0 % (ref 0–5)
HCT: 31.6 % — ABNORMAL LOW (ref 36.0–46.0)
Hemoglobin: 10.3 g/dL — ABNORMAL LOW (ref 12.0–15.0)
Lymphocytes Relative: 10 % — ABNORMAL LOW (ref 12–46)
Lymphs Abs: 1 10*3/uL (ref 0.7–4.0)
MCH: 27.2 pg (ref 26.0–34.0)
MCHC: 32.6 g/dL (ref 30.0–36.0)
MCV: 83.6 fL (ref 78.0–100.0)
Monocytes Absolute: 0.2 10*3/uL (ref 0.1–1.0)
Monocytes Relative: 2 % — ABNORMAL LOW (ref 3–12)
NEUTROS ABS: 8.6 10*3/uL — AB (ref 1.7–7.7)
NEUTROS PCT: 88 % — AB (ref 43–77)
PLATELETS: 331 10*3/uL (ref 150–400)
RBC: 3.78 MIL/uL — ABNORMAL LOW (ref 3.87–5.11)
RDW: 13.2 % (ref 11.5–15.5)
WBC: 9.8 10*3/uL (ref 4.0–10.5)

## 2013-12-22 LAB — GLUCOSE, CAPILLARY
GLUCOSE-CAPILLARY: 224 mg/dL — AB (ref 70–99)
GLUCOSE-CAPILLARY: 154 mg/dL — AB (ref 70–99)
Glucose-Capillary: 149 mg/dL — ABNORMAL HIGH (ref 70–99)
Glucose-Capillary: 148 mg/dL — ABNORMAL HIGH (ref 70–99)
Glucose-Capillary: 184 mg/dL — ABNORMAL HIGH (ref 70–99)

## 2013-12-22 LAB — LIPASE, BLOOD: LIPASE: 89 U/L — AB (ref 11–59)

## 2013-12-22 LAB — COMPREHENSIVE METABOLIC PANEL
ALBUMIN: 2.9 g/dL — AB (ref 3.5–5.2)
ALK PHOS: 64 U/L (ref 39–117)
ALT: 6 U/L (ref 0–35)
AST: 8 U/L (ref 0–37)
BUN: 7 mg/dL (ref 6–23)
CHLORIDE: 105 meq/L (ref 96–112)
CO2: 21 mEq/L (ref 19–32)
Calcium: 8.6 mg/dL (ref 8.4–10.5)
Creatinine, Ser: 0.59 mg/dL (ref 0.50–1.10)
GFR calc Af Amer: >90 mL/min (ref >90)
GFR calc non Af Amer: >90 mL/min (ref >90)
Glucose, Bld: 173 mg/dL — ABNORMAL HIGH (ref 70–99)
POTASSIUM: 4.4 meq/L (ref 3.7–5.3)
SODIUM: 139 meq/L (ref 137–147)
TOTAL PROTEIN: 7.1 g/dL (ref 6.0–8.3)
Total Bilirubin: <0.2 mg/dL — ABNORMAL LOW (ref 0.3–1.2)

## 2013-12-22 LAB — PHOSPHORUS: Phosphorus: 2.1 mg/dL — ABNORMAL LOW (ref 2.3–4.6)

## 2013-12-22 LAB — MAGNESIUM: Magnesium: 2.1 mg/dL (ref 1.5–2.5)

## 2013-12-22 MED ORDER — PREDNISONE 20 MG PO TABS
40.0000 mg | ORAL_TABLET | Freq: Two times a day (BID) | ORAL | Status: DC
  Administered 2013-12-23: 40 mg via ORAL
  Filled 2013-12-22 (×3): qty 2

## 2013-12-22 MED ORDER — PREDNISONE 20 MG PO TABS
40.0000 mg | ORAL_TABLET | Freq: Every day | ORAL | Status: DC
  Administered 2013-12-22: 40 mg via ORAL
  Filled 2013-12-22 (×2): qty 2

## 2013-12-22 MED ORDER — PREDNISONE 50 MG PO TABS
50.0000 mg | ORAL_TABLET | Freq: Every day | ORAL | Status: DC
  Filled 2013-12-22 (×2): qty 1

## 2013-12-22 NOTE — Progress Notes (Signed)
Kathleen OsmondJennifer S Mcdonald 12:24 PM  Subjective: Patient with some atypical chest pain but her stomach and bowels are better and she wants to eat and has not seen any blood  Objective: Vital signs stable afebrile no acute distress abdomen is soft nontender hemoglobin stayed lipase improved  Assessment: Inflammatory bowel disease questionable pancreatitis  Plan: Decrease prednisone to 40mg  slowly advance diet hopefully will be able to go home soon and followup in the office in one week with my partner Dr. Evette CristalGanem  [place] [facility] [place],Kathleen Mcdonald

## 2013-12-22 NOTE — Progress Notes (Signed)
[facility] PROGRESS NOTE  Kathleen Mcdonald ZOX:[id number] DOB: 07/29/1981 DOA: 12/20/2013 PCP: Per Patient No Pcp  Assessment/Plan: Ulcerative colitis, acute  - Consulted to GI. We will follow up with their recommendations.  - Per GI recommendations: Decrease prednisone to 40mg  slowly advance diet hopefully will be able to go home soon and followup in the office in one week with my partner Dr. Evette CristalGanem   Elevated lipase - Continues to trend down - CT of abdomen recently done did not report any pancreatitis. - Advance diet to low-fat diet  Palpitations - EKG obtain an within normal limits - Resolved after changing from Solu-Medrol to prednisone  DVT prophylaxis  - Heparin  Code Status: full Family Communication: Discussed with patient and family member at bedside Disposition Plan: Pending continued improvement in condition.   Consultants:  Gastroenterology  Procedures:  CT of abdomen and pelvis  Antibiotics:  None  HPI/Subjective: Continues to feel improvement. Reportedly has some palpitations today. Has had palpitations before in the past. While in the ED 11/06/2011 was diagnosed with palpitations normal EKG and normal i-STAT.  Objective: Filed Vitals:   12/22/13 1433  BP: 114/67  Pulse: 94  Temp: 97.9 F (36.6 C)  Resp: 18    Intake/Output Summary (Last 24 hours) at 12/22/13 1815 Last data filed at 12/22/13 1300  Gross per 24 hour  Intake   2220 ml  Output      0 ml  Net   2220 ml   Filed Weights   12/20/13 2244  Weight: 74.844 kg (165 lb)    Exam:   General:  Pt in NAD, Alert and awake  Cardiovascular: RRR, no murmurs, rubs  Respiratory: CTA BL, no wheezes  Abdomen: soft, ND, + Bowel sounds  Musculoskeletal: no cyanosis or clubbing   Data Reviewed: Basic Metabolic Panel:  Recent Labs Lab 12/20/13 1355 12/21/13 0537 12/22/13 0410 12/22/13 0923  NA 138 137 139  --   K 4.0 4.1 4.4  --   CL 98 101 105  --   CO2 23 22 21   --    GLUCOSE 79 212* 173*  --   BUN 4* 4* 7  --   CREATININE 0.67 0.57 0.59  --   CALCIUM 9.1 8.5 8.6  --   MG 1.8  --   --  2.1  PHOS 2.3  --   --  2.1*   Liver Function Tests:  Recent Labs Lab 12/20/13 1355 12/22/13 0410  AST 11 8  ALT 5 6  ALKPHOS 66 64  BILITOT <0.2* <0.2*  PROT 7.7 7.1  ALBUMIN 3.2* 2.9*    Recent Labs Lab 12/20/13 1355 12/21/13 0530 12/22/13 0410  LIPASE 1005* 158* 89*  AMYLASE 292* 80  --    No results found for this basename: AMMONIA,  in the last 168 hours CBC:  Recent Labs Lab 12/20/13 1355 12/21/13 0539 12/22/13 0410  WBC 8.9 4.8 9.8  NEUTROABS 6.1  --  8.6*  HGB 11.3* 10.2* 10.3*  HCT 32.9* 30.8* 31.6*  MCV 81.2 82.6 83.6  PLT 309 300 331   Cardiac Enzymes: No results found for this basename: CKTOTAL, CKMB, CKMBINDEX, TROPONINI,  in the last 168 hours BNP (last 3 results) No results found for this basename: PROBNP,  in the last 8760 hours CBG:  Recent Labs Lab 12/21/13 1711 12/21/13 2153 12/22/13 0715 12/22/13 1301 12/22/13 1703  GLUCAP 193* 149* 148* 224* 154*    No results found for this or any previous visit (  from the past 240 hour(s)).   Studies: No results found.  Scheduled Meds: . heparin  5,000 Units Subcutaneous Q8H  . influenza vac split quadrivalent PF  0.5 mL Intramuscular Tomorrow-1000  . insulin aspart  0-9 Units Subcutaneous TID WC  . loratadine  10 mg Oral Daily  . Norgestimate-Ethinyl Estradiol Triphasic  1 tablet Oral QHS  . predniSONE  40 mg Oral Q breakfast   Continuous Infusions: . dextrose 5 % and 0.45 % NaCl with KCl 20 mEq/L 75 mL/hr at 12/22/13 0940    Active Problems:   Ulcerative colitis, acute   Acute ulcerative colitis   Elevated lipase    Time spent: > 35 minutes    Penny Pia  [facility] Pager [phone]. If 7PM-7AM, please contact night-coverage at [email or web address], password [facility] 12/22/2013, 6:15 PM  LOS: 2 days

## 2013-12-22 NOTE — Progress Notes (Signed)
Spoke with MD Cena BentonVega to inform him of patient's complaint of feeling a little dizzy, and having a fluttering feeling in her heart, patient was seen by a doctor previously for heart and was told that she has an irregular rhythm but was not placed on any medication. Vital are currently stable, will obtain a EKG and transfer patient to telemetry as MD ordered Stanford BreedBracey, Vinal Rosengrant N RN 12-22-2013 8:41am

## 2013-12-23 LAB — GLUCOSE, CAPILLARY
Glucose-Capillary: 113 mg/dL — ABNORMAL HIGH (ref 70–99)
Glucose-Capillary: 258 mg/dL — ABNORMAL HIGH (ref 70–99)

## 2013-12-23 MED ORDER — PREDNISONE 20 MG PO TABS: 40.0000 mg | ORAL_TABLET | Freq: Every day | ORAL | Status: AC

## 2013-12-23 MED ORDER — TRAMADOL HCL 50 MG PO TABS: 50.0000 mg | ORAL_TABLET | Freq: Four times a day (QID) | ORAL | Status: DC | PRN

## 2013-12-23 MED ORDER — ONDANSETRON 4 MG PO TBDP: 4.0000 mg | ORAL_TABLET | Freq: Three times a day (TID) | ORAL | Status: DC | PRN

## 2013-12-23 MED ORDER — PREDNISONE 10 MG PO TABS: 30.0000 mg | ORAL_TABLET | Freq: Every day | ORAL | Status: DC

## 2013-12-23 NOTE — Progress Notes (Signed)
Kathleen OsmondJennifer S Mcdonald 10:42 AM  Subjective: Patient doing much better even though she saw little blood in her stool today and her chest pain is better and she is not having any new complaints and her case was extensively discussed with her family Objective: Vital signs stable afebrile no acute distress abdomen is soft nontender labs stable  Assessment: Improved  Plan: Okay with me to go home today on 40 of prednisone and can decrease to 30 on Monday if doing well and then I will see her in a week or 2 in the office and we should hold Aldactone metformin and lialda for now and she can call us sooner when necessary and we discussed diet with the family as well and the warnings of what to look out for at home as well as the prednisone side effects   Kathleen Mcdonald E

## 2013-12-23 NOTE — Discharge Summary (Signed)
Physician Discharge Summary  KHUSHBOO CHUCK ZOX:[id number] DOB: June 17, 1981 DOA: 12/20/2013  PCP: Per Patient No Pcp  Admit date: 12/20/2013 Discharge date: 12/23/2013  Time spent: > 35 minutes  Recommendations for Outpatient Follow-up:  1. Please be sure to followup with gastroenterology within one to 2 weeks. 2. Reassess hemoglobin levels 3. Reassess blood sugar levels  Discharge Diagnoses:  Active Problems:   Ulcerative colitis, acute   Acute ulcerative colitis   Elevated lipase   Discharge Condition: Stable Diet recommendation: Fat restricted diet  Filed Weights   12/20/13 2244  Weight: 74.844 kg (165 lb)    History of present illness:  33 year old Caucasian female with history of ulcerative colitis diagnosed approximately 6 months ago that presented to the ED complaining of abdominal discomfort, diarrhea, and bright red blood per rectum.  Hospital Course:  Ulcerative colitis, acute  - Consulted to GI.  - Per GI recommendations:   Okay with me to go home today on 40 of prednisone and can decrease to 30 on Monday if doing well and then I will see her in a week or 2 in the office and we should hold Aldactone metformin and lialda for now and she can call us sooner when necessary and we discussed diet with the family as well and the warnings of what to look out for at home as well as the prednisone side effects  - Hemoglobin level steady on discharge.  Elevated lipase  - Continued to trend down  - CT of abdomen recently done did not report any pancreatitis.  - Diet: low-fat diet   Palpitations  - EKG obtain an within normal limits  - Most likely related to Solu-Medrol which was discontinued the  Hyperglycemia - Patient will be discharged with lower dose of prednisone as such suspect blood sugars will be closer to normal values. -Should resolve with discontinuation of prednisone  Procedures:  None  Consultations:  GI: Dr. Ewing Schlein  Discharge Exam: Filed Vitals:    12/22/13 2200  BP: 113/70  Pulse: 66  Temp: 98 F (36.7 C)  Resp: 18    General: Pt in NAD, Alert and awake Cardiovascular: RRR, no MRG Respiratory: CTA BL, no wheezes  Discharge Instructions  Discharge Orders   Future Orders Complete By Expires   Call MD for:  severe uncontrolled pain  As directed    Call MD for:  temperature >100.4  As directed    Diet - low sodium heart healthy  As directed    Increase activity slowly  As directed        Medication List    STOP taking these medications       mesalamine 1.2 G EC tablet  Commonly known as:  LIALDA     metFORMIN 500 MG tablet  Commonly known as:  GLUCOPHAGE     spironolactone 100 MG tablet  Commonly known as:  ALDACTONE      TAKE these medications       cetirizine 10 MG tablet  Commonly known as:  ZYRTEC  Take 10 mg by mouth at bedtime.     cyclobenzaprine 5 MG tablet  Commonly known as:  FLEXERIL  Take 5-10 mg by mouth at bedtime as needed for muscle spasms.     ondansetron 4 MG disintegrating tablet  Commonly known as:  ZOFRAN ODT  Take 1 tablet (4 mg total) by mouth every 8 (eight) hours as needed for nausea or vomiting.     predniSONE 20 MG tablet  Commonly known  as:  DELTASONE  Take 2 tablets (40 mg total) by mouth daily with breakfast.     predniSONE 10 MG tablet  Commonly known as:  DELTASONE  Take 3 tablets (30 mg total) by mouth daily with breakfast.  Start taking on:  12/29/2013     traMADol 50 MG tablet  Commonly known as:  ULTRAM  Take 1 tablet (50 mg total) by mouth every 6 (six) hours as needed for moderate pain.     TRINESSA (28) PO  Take 1 tablet by mouth daily.       Allergies  Allergen Reactions  . Chocolate Swelling  . Latex Itching and Swelling  . Nickel Swelling      The results of significant diagnostics from this hospitalization (including imaging, microbiology, ancillary and laboratory) are listed below for reference.    Significant Diagnostic Studies: Ct  Abdomen Pelvis W Contrast  12/18/2013   CLINICAL DATA:  Left abdominal pain, nausea, melena, diarrhea, fever. Ulcerative colitis.  EXAM: CT ABDOMEN AND PELVIS WITH CONTRAST  TECHNIQUE: Multidetector CT imaging of the abdomen and pelvis was performed using the standard protocol following bolus administration of intravenous contrast.  CONTRAST:  OMNIPAQUE IOHEXOL 300 MG/ML  SOLN  COMPARISON:  None.  FINDINGS: Lung bases are clear.  Liver is notable for focal fat/altered perfusion along the falciform ligament (series 2/ image 21).  Spleen, pancreas, and adrenal glands are within normal limits.  Gallbladder is unremarkable. No intrahepatic or extrahepatic ductal dilatation.  Kidneys are within normal limits.  No hydronephrosis.  No evidence of bowel obstruction. Normal appendix. Mild wall thickening involving the hepatic flexure of the colon (series 2/image 31). Mild wall thickening involving the sigmoid colon/rectum, likely related to known ulcerative colitis.  No drainable fluid collection/abscess.  No evidence of abdominal aortic aneurysm.  No abdominopelvic ascites.  Mildly prominent nodes in the jejunal mesentery and sigmoid mesial colon measuring up to 10 mm short axis (series 2/ images 34 and 57), likely reactive.  Uterus and right ovary are within normal limits. 4.5 x 3.9 cm left ovarian cyst/follicle (series 2/image 70), likely physiologic.  Bladder is within normal limits.  Visualized osseous structures are within normal limits.  IMPRESSION: Colonic wall thickening involving the sigmoid colon/rectum and hepatic flexure, corresponding to known ulcerative colitis.  No evidence of bowel obstruction. No drainable fluid collection/abscess.  4.5 cm left ovarian cyst/follicle, likely physiologic. Consider follow-up pelvic ultrasound in 6-10 weeks as clinically warranted.   Electronically Signed   By: Charline Bills M.D.   On: 12/18/2013 16:31    Microbiology: No results found for this or any previous  visit (from the past 240 hour(s)).   Labs: Basic Metabolic Panel:  Recent Labs Lab 12/20/13 1355 12/21/13 0537 12/22/13 0410 12/22/13 0923  NA 138 137 139  --   K 4.0 4.1 4.4  --   CL 98 101 105  --   CO2 23 22 21   --   GLUCOSE 79 212* 173*  --   BUN 4* 4* 7  --   CREATININE 0.67 0.57 0.59  --   CALCIUM 9.1 8.5 8.6  --   MG 1.8  --   --  2.1  PHOS 2.3  --   --  2.1*   Liver Function Tests:  Recent Labs Lab 12/20/13 1355 12/22/13 0410  AST 11 8  ALT 5 6  ALKPHOS 66 64  BILITOT <0.2* <0.2*  PROT 7.7 7.1  ALBUMIN 3.2* 2.9*    Recent Labs  Lab 12/20/13 1355 12/21/13 0530 12/22/13 0410  LIPASE 1005* 158* 89*  AMYLASE 292* 80  --    No results found for this basename: AMMONIA,  in the last 168 hours CBC:  Recent Labs Lab 12/20/13 1355 12/21/13 0539 12/22/13 0410  WBC 8.9 4.8 9.8  NEUTROABS 6.1  --  8.6*  HGB 11.3* 10.2* 10.3*  HCT 32.9* 30.8* 31.6*  MCV 81.2 82.6 83.6  PLT 309 300 331   Cardiac Enzymes: No results found for this basename: CKTOTAL, CKMB, CKMBINDEX, TROPONINI,  in the last 168 hours BNP: BNP (last 3 results) No results found for this basename: PROBNP,  in the last 8760 hours CBG:  Recent Labs Lab 12/22/13 1301 12/22/13 1703 12/22/13 2209 12/23/13 0712 12/23/13 1133  GLUCAP 224* 154* 184* 113* 258*       Signed:  Penny PiaVEGA, Luanne Krzyzanowski  [facility] 12/23/2013, 11:56 AM

## 2014-03-01 ENCOUNTER — Emergency Department (HOSPITAL_COMMUNITY)
Admission: EM | Admit: 2014-03-01 | Discharge: 2014-03-02 | Disposition: A | Discharge status: Home / Self Care | Payer: BC Managed Care – PPO | Attending: Emergency Medicine | Admitting: Emergency Medicine

## 2014-03-01 ENCOUNTER — Encounter (HOSPITAL_COMMUNITY): Payer: Self-pay | Admitting: Emergency Medicine

## 2014-03-01 ENCOUNTER — Emergency Department (HOSPITAL_COMMUNITY): Payer: BC Managed Care – PPO

## 2014-03-01 DIAGNOSIS — Z87891 Personal history of nicotine dependence: Secondary | ICD-10-CM | POA: Insufficient documentation

## 2014-03-01 DIAGNOSIS — Z8639 Personal history of other endocrine, nutritional and metabolic disease: Secondary | ICD-10-CM | POA: Insufficient documentation

## 2014-03-01 DIAGNOSIS — IMO0002 Reserved for concepts with insufficient information to code with codable children: Secondary | ICD-10-CM | POA: Insufficient documentation

## 2014-03-01 DIAGNOSIS — I10 Essential (primary) hypertension: Secondary | ICD-10-CM | POA: Insufficient documentation

## 2014-03-01 DIAGNOSIS — Z862 Personal history of diseases of the blood and blood-forming organs and certain disorders involving the immune mechanism: Secondary | ICD-10-CM | POA: Insufficient documentation

## 2014-03-01 DIAGNOSIS — Z3202 Encounter for pregnancy test, result negative: Secondary | ICD-10-CM | POA: Insufficient documentation

## 2014-03-01 DIAGNOSIS — Z79899 Other long term (current) drug therapy: Secondary | ICD-10-CM | POA: Insufficient documentation

## 2014-03-01 DIAGNOSIS — K5190 Ulcerative colitis, unspecified, without complications: Secondary | ICD-10-CM | POA: Insufficient documentation

## 2014-03-01 DIAGNOSIS — Z91040 Latex allergy status: Secondary | ICD-10-CM | POA: Insufficient documentation

## 2014-03-01 HISTORY — DX: Essential (primary) hypertension: I10

## 2014-03-01 LAB — CBC WITH DIFFERENTIAL/PLATELET
BASOS ABS: 0 10*3/uL (ref 0.0–0.1)
Basophils Relative: 0 % (ref 0–1)
EOS ABS: 0.1 10*3/uL (ref 0.0–0.7)
EOS PCT: 1 % (ref 0–5)
HCT: 36 % (ref 36.0–46.0)
Hemoglobin: 11.5 g/dL — ABNORMAL LOW (ref 12.0–15.0)
Lymphocytes Relative: 28 % (ref 12–46)
Lymphs Abs: 2.5 10*3/uL (ref 0.7–4.0)
MCH: 24.9 pg — AB (ref 26.0–34.0)
MCHC: 31.9 g/dL (ref 30.0–36.0)
MCV: 78.1 fL (ref 78.0–100.0)
Monocytes Absolute: 1 10*3/uL (ref 0.1–1.0)
Monocytes Relative: 12 % (ref 3–12)
Neutro Abs: 5.3 10*3/uL (ref 1.7–7.7)
Neutrophils Relative %: 60 % (ref 43–77)
PLATELETS: 435 10*3/uL — AB (ref 150–400)
RBC: 4.61 MIL/uL (ref 3.87–5.11)
RDW: 13 % (ref 11.5–15.5)
WBC: 8.9 10*3/uL (ref 4.0–10.5)

## 2014-03-01 LAB — COMPREHENSIVE METABOLIC PANEL
ALT: 6 U/L (ref 0–35)
AST: 9 U/L (ref 0–37)
Albumin: 3 g/dL — ABNORMAL LOW (ref 3.5–5.2)
Alkaline Phosphatase: 51 U/L (ref 39–117)
BUN: 6 mg/dL (ref 6–23)
CALCIUM: 9.5 mg/dL (ref 8.4–10.5)
CO2: 27 mEq/L (ref 19–32)
Chloride: 99 mEq/L (ref 96–112)
Creatinine, Ser: 0.81 mg/dL (ref 0.50–1.10)
GFR calc non Af Amer: >90 mL/min (ref >90)
GLUCOSE: 92 mg/dL (ref 70–99)
Potassium: 3.5 mEq/L — ABNORMAL LOW (ref 3.7–5.3)
SODIUM: 140 meq/L (ref 137–147)
Total Bilirubin: <0.2 mg/dL — ABNORMAL LOW (ref 0.3–1.2)
Total Protein: 6.9 g/dL (ref 6.0–8.3)

## 2014-03-01 LAB — URINALYSIS, ROUTINE W REFLEX MICROSCOPIC
Glucose, UA: NEGATIVE mg/dL
HGB URINE DIPSTICK: NEGATIVE
Ketones, ur: NEGATIVE mg/dL
Leukocytes, UA: NEGATIVE
Nitrite: NEGATIVE
Protein, ur: NEGATIVE mg/dL
Specific Gravity, Urine: 1.026 (ref 1.005–1.030)
UROBILINOGEN UA: 0.2 mg/dL (ref 0.0–1.0)
pH: 6.5 (ref 5.0–8.0)

## 2014-03-01 LAB — LIPASE, BLOOD: Lipase: 68 U/L — ABNORMAL HIGH (ref 11–59)

## 2014-03-01 LAB — PREGNANCY, URINE: Preg Test, Ur: NEGATIVE

## 2014-03-01 MED ORDER — IOHEXOL 300 MG/ML  SOLN
100.0000 mL | Freq: Once | INTRAMUSCULAR | Status: AC | PRN
  Administered 2014-03-01: 50 mL via INTRAVENOUS

## 2014-03-01 MED ORDER — MORPHINE SULFATE 4 MG/ML IJ SOLN
4.0000 mg | Freq: Once | INTRAMUSCULAR | Status: AC
  Administered 2014-03-01: 4 mg via INTRAVENOUS
  Filled 2014-03-01: qty 1

## 2014-03-01 MED ORDER — IOHEXOL 300 MG/ML  SOLN: 100.0000 mL | Freq: Once | INTRAMUSCULAR | Status: AC | PRN

## 2014-03-01 MED ORDER — ONDANSETRON HCL 4 MG/2ML IJ SOLN
4.0000 mg | Freq: Once | INTRAMUSCULAR | Status: AC
  Administered 2014-03-01: 4 mg via INTRAVENOUS
  Filled 2014-03-01: qty 2

## 2014-03-01 MED ORDER — SODIUM CHLORIDE 0.9 % IV BOLUS (SEPSIS)
2000.0000 mL | Freq: Once | INTRAVENOUS | Status: AC
Start: 2014-03-01 — End: 2014-03-01
  Administered 2014-03-01: 2000 mL via INTRAVENOUS

## 2014-03-01 NOTE — ED Notes (Signed)
Pt reports hx of ulcerative colitis, has been having bloody diarrhea for the past 3 weeks, has been nauseated. Denies vomiting. X15 episodes of bloody diarrhea today. Pt states that she has become dizzy. Pt a&o, skin warm and dry, ambulatory to triage.

## 2014-03-01 NOTE — ED Notes (Signed)
Attempted IV x2 and was successful.

## 2014-03-02 MED ORDER — IOHEXOL 300 MG/ML  SOLN
100.0000 mL | Freq: Once | INTRAMUSCULAR | Status: AC | PRN
  Administered 2014-03-02: 100 mL via INTRAVENOUS

## 2014-03-02 MED ORDER — PREDNISONE 50 MG PO TABS: 50.0000 mg | ORAL_TABLET | Freq: Every day | ORAL | Status: DC

## 2014-03-02 MED ORDER — HYDROCODONE-ACETAMINOPHEN 5-325 MG PO TABS: 1.0000 | ORAL_TABLET | Freq: Four times a day (QID) | ORAL | Status: DC | PRN

## 2014-03-02 NOTE — Discharge Instructions (Signed)
You will need to see your GI doctor, soon as possible.  Return here for any worsening in your condition

## 2014-03-02 NOTE — ED Provider Notes (Signed)
CSN: [id number]     Arrival date & time 03/01/14  1856 History   First MD Initiated Contact with Patient 03/01/14 2006     Chief Complaint  Patient presents with  . Abdominal Pain  . Diarrhea     (Consider location/radiation/quality/duration/timing/severity/associated sxs/prior Treatment) HPI Patient presents to the emergency department with rectal bleeding for the last 3 weeks.  Patient has a history of ulcerative colitis.  She denies any vomiting, headache, blurred vision, weakness, back pain, dysuria, fever, testing, shortness of breath, rash, or syncope.  The patient, states she's had some mild dizziness.  Patient, states, that she's not take any medications prior to arrival.  She states she saw her GI doctor, who increased her prednisone   Past Medical History  Diagnosis Date  . Irregular heart beat   . Ulcerative colitis   . PCOS (polycystic ovarian syndrome)   . Hypertension    Past Surgical History  Procedure Laterality Date  . Wisdom tooth extraction     Family History  Problem Relation Age of Onset  . Anesthesia problems Neg Hx    History  Substance Use Topics  . Smoking status: Former Smoker    Quit date: 11/28/2011  . Smokeless tobacco: Never Used  . Alcohol Use: Yes     Comment: occas., once a month or less.   OB History   Grav Para Term Preterm Abortions TAB SAB Ect Mult Living   0              Review of Systems  All other systems negative except as documented in the HPI. All pertinent positives and negatives as reviewed in the HPI.  Allergies  Chocolate; Latex; and Nickel  Home Medications   Current Outpatient Rx  Name  Route  Sig  Dispense  Refill  . escitalopram (LEXAPRO) 20 MG tablet   Oral   Take 20 mg by mouth daily.         Marland Kitchen loratadine (CLARITIN) 10 MG tablet   Oral   Take 10 mg by mouth daily.         Lorita Officer Triphasic (TRINESSA, 28, PO)   Oral   Take 1 tablet by mouth daily.         . pantoprazole (PROTONIX)  40 MG tablet   Oral   Take 40 mg by mouth daily.         . predniSONE (DELTASONE) 20 MG tablet   Oral   Take 20 mg by mouth at bedtime.         . traMADol (ULTRAM) 50 MG tablet   Oral   Take 50 mg by mouth every 8 (eight) hours as needed for moderate pain.          BP 135/87  Pulse 80  Temp(Src) 97.8 F (36.6 C) (Oral)  Resp 20  Ht 5\' 4"  (1.626 m)  Wt 155 lb (70.308 kg)  BMI 26.59 kg/m2  SpO2 100%  LMP 02/24/2014 Physical Exam  Nursing note and vitals reviewed. Constitutional: She is oriented to person, place, and time. She appears well-developed and well-nourished.  HENT:  Head: Normocephalic and atraumatic.  Mouth/Throat: Oropharynx is clear and moist.  Eyes: Pupils are equal, round, and reactive to light.  Neck: Normal range of motion. Neck supple.  Cardiovascular: Normal rate, regular rhythm and normal heart sounds.  Exam reveals no gallop and no friction rub.   No murmur heard. Pulmonary/Chest: Effort normal and breath sounds normal. No respiratory distress.  Abdominal:  Soft. Normal appearance and bowel sounds are normal. She exhibits no distension. There is generalized tenderness. There is no rebound and no guarding.  Neurological: She is alert and oriented to person, place, and time. She exhibits normal muscle tone. Coordination normal.  Skin: Skin is warm and dry.    ED Course  Procedures (including critical care time) Labs Review Labs Reviewed  CBC WITH DIFFERENTIAL - Abnormal; Notable for the following:    Hemoglobin 11.5 (*)    MCH 24.9 (*)    Platelets 435 (*)    All other components within normal limits  COMPREHENSIVE METABOLIC PANEL - Abnormal; Notable for the following:    Potassium 3.5 (*)    Albumin 3.0 (*)    Total Bilirubin <0.2 (*)    All other components within normal limits  LIPASE, BLOOD - Abnormal; Notable for the following:    Lipase 68 (*)    All other components within normal limits  URINALYSIS, ROUTINE W REFLEX MICROSCOPIC -  Abnormal; Notable for the following:    APPearance CLOUDY (*)    Bilirubin Urine SMALL (*)    All other components within normal limits  PREGNANCY, URINE     Patient's testing here tonight did not show any significant anemia CT scan shows some slightly worsening, colonic inflammation.  I will advise the patient to speak.  Her GI doctor tomorrow about further course of action at this time.  She does not appear unstable and will be discharged home and advised to followup.  She's given IV fluids and told to return here as needed.  Carlyle Dollyhristopher W Shazia Mitchener, PA-C 03/02/14 [phone]

## 2014-03-03 NOTE — ED Provider Notes (Signed)
Medical screening examination/treatment/procedure(s) were performed by non-physician practitioner and as supervising physician I was immediately available for consultation/collaboration.   Toy BakerAnthony T Myrel Rappleye, MD 03/03/14 [phone]

## 2014-08-29 ENCOUNTER — Emergency Department (HOSPITAL_COMMUNITY): Payer: BC Managed Care – PPO

## 2014-08-29 ENCOUNTER — Emergency Department (HOSPITAL_COMMUNITY)
Admission: EM | Admit: 2014-08-29 | Discharge: 2014-08-29 | Disposition: A | Discharge status: Home / Self Care | Payer: BC Managed Care – PPO | Attending: Emergency Medicine | Admitting: Emergency Medicine

## 2014-08-29 ENCOUNTER — Encounter (HOSPITAL_COMMUNITY): Payer: Self-pay | Admitting: Emergency Medicine

## 2014-08-29 DIAGNOSIS — Z79899 Other long term (current) drug therapy: Secondary | ICD-10-CM | POA: Insufficient documentation

## 2014-08-29 DIAGNOSIS — Z87891 Personal history of nicotine dependence: Secondary | ICD-10-CM | POA: Diagnosis not present

## 2014-08-29 DIAGNOSIS — Z3202 Encounter for pregnancy test, result negative: Secondary | ICD-10-CM | POA: Insufficient documentation

## 2014-08-29 DIAGNOSIS — I10 Essential (primary) hypertension: Secondary | ICD-10-CM | POA: Diagnosis not present

## 2014-08-29 DIAGNOSIS — Z7952 Long term (current) use of systemic steroids: Secondary | ICD-10-CM | POA: Diagnosis not present

## 2014-08-29 DIAGNOSIS — K51911 Ulcerative colitis, unspecified with rectal bleeding: Secondary | ICD-10-CM | POA: Diagnosis not present

## 2014-08-29 DIAGNOSIS — R109 Unspecified abdominal pain: Secondary | ICD-10-CM | POA: Diagnosis present

## 2014-08-29 DIAGNOSIS — Z8639 Personal history of other endocrine, nutritional and metabolic disease: Secondary | ICD-10-CM | POA: Diagnosis not present

## 2014-08-29 LAB — COMPREHENSIVE METABOLIC PANEL
ALBUMIN: 3.1 g/dL — AB (ref 3.5–5.2)
ALT: 8 U/L (ref 0–35)
AST: 17 U/L (ref 0–37)
Alkaline Phosphatase: 66 U/L (ref 39–117)
Anion gap: 15 (ref 5–15)
BUN: 6 mg/dL (ref 6–23)
CHLORIDE: 101 meq/L (ref 96–112)
CO2: 24 meq/L (ref 19–32)
Calcium: 8.9 mg/dL (ref 8.4–10.5)
Creatinine, Ser: 0.68 mg/dL (ref 0.50–1.10)
GFR calc Af Amer: >90 mL/min (ref >90)
Glucose, Bld: 159 mg/dL — ABNORMAL HIGH (ref 70–99)
Potassium: 3.7 mEq/L (ref 3.7–5.3)
SODIUM: 140 meq/L (ref 137–147)
Total Bilirubin: <0.2 mg/dL — ABNORMAL LOW (ref 0.3–1.2)
Total Protein: 7.2 g/dL (ref 6.0–8.3)

## 2014-08-29 LAB — CBC WITH DIFFERENTIAL/PLATELET
BASOS ABS: 0 10*3/uL (ref 0.0–0.1)
Basophils Relative: 0 % (ref 0–1)
EOS PCT: 3 % (ref 0–5)
Eosinophils Absolute: 0.2 10*3/uL (ref 0.0–0.7)
HEMATOCRIT: 39 % (ref 36.0–46.0)
HEMOGLOBIN: 12.5 g/dL (ref 12.0–15.0)
Lymphocytes Relative: 11 % — ABNORMAL LOW (ref 12–46)
Lymphs Abs: 0.9 10*3/uL (ref 0.7–4.0)
MCH: 25.5 pg — ABNORMAL LOW (ref 26.0–34.0)
MCHC: 32.1 g/dL (ref 30.0–36.0)
MCV: 79.6 fL (ref 78.0–100.0)
MONO ABS: 0.4 10*3/uL (ref 0.1–1.0)
MONOS PCT: 5 % (ref 3–12)
NEUTROS ABS: 6.9 10*3/uL (ref 1.7–7.7)
Neutrophils Relative %: 81 % — ABNORMAL HIGH (ref 43–77)
Platelets: 483 10*3/uL — ABNORMAL HIGH (ref 150–400)
RBC: 4.9 MIL/uL (ref 3.87–5.11)
RDW: 17 % — AB (ref 11.5–15.5)
WBC: 8.5 10*3/uL (ref 4.0–10.5)

## 2014-08-29 LAB — URINALYSIS, ROUTINE W REFLEX MICROSCOPIC
Bilirubin Urine: NEGATIVE
Glucose, UA: NEGATIVE mg/dL
Ketones, ur: NEGATIVE mg/dL
Leukocytes, UA: NEGATIVE
NITRITE: NEGATIVE
PROTEIN: NEGATIVE mg/dL
SPECIFIC GRAVITY, URINE: 1.017 (ref 1.005–1.030)
Urobilinogen, UA: 0.2 mg/dL (ref 0.0–1.0)
pH: 6 (ref 5.0–8.0)

## 2014-08-29 LAB — URINE MICROSCOPIC-ADD ON

## 2014-08-29 LAB — MAGNESIUM: Magnesium: 1.9 mg/dL (ref 1.5–2.5)

## 2014-08-29 LAB — APTT: aPTT: 28 seconds (ref 24–37)

## 2014-08-29 LAB — LIPASE, BLOOD: LIPASE: 273 U/L — AB (ref 11–59)

## 2014-08-29 LAB — PROTIME-INR
INR: 1.14 (ref 0.00–1.49)
Prothrombin Time: 14.8 seconds (ref 11.6–15.2)

## 2014-08-29 LAB — POC URINE PREG, ED: PREG TEST UR: NEGATIVE

## 2014-08-29 LAB — PHOSPHORUS: Phosphorus: 2.9 mg/dL (ref 2.3–4.6)

## 2014-08-29 MED ORDER — SODIUM CHLORIDE 0.9 % IV SOLN: INTRAVENOUS | Status: DC

## 2014-08-29 MED ORDER — IOHEXOL 300 MG/ML  SOLN
100.0000 mL | Freq: Once | INTRAMUSCULAR | Status: AC | PRN
  Administered 2014-08-29: 100 mL via INTRAVENOUS

## 2014-08-29 MED ORDER — TRAMADOL HCL 50 MG PO TABS: 50.0000 mg | ORAL_TABLET | Freq: Four times a day (QID) | ORAL | Status: DC | PRN

## 2014-08-29 MED ORDER — ONDANSETRON HCL 4 MG/2ML IJ SOLN
4.0000 mg | Freq: Once | INTRAMUSCULAR | Status: AC
  Administered 2014-08-29: 4 mg via INTRAVENOUS
  Filled 2014-08-29: qty 2

## 2014-08-29 MED ORDER — IOHEXOL 300 MG/ML  SOLN
50.0000 mL | Freq: Once | INTRAMUSCULAR | Status: AC | PRN
  Administered 2014-08-29: 50 mL via ORAL

## 2014-08-29 MED ORDER — PREDNISONE 20 MG PO TABS
60.0000 mg | ORAL_TABLET | Freq: Once | ORAL | Status: AC
  Administered 2014-08-29: 60 mg via ORAL
  Filled 2014-08-29: qty 3

## 2014-08-29 MED ORDER — PREDNISONE 20 MG PO TABS: ORAL_TABLET | ORAL | Status: DC

## 2014-08-29 MED ORDER — ONDANSETRON 4 MG PO TBDP: 4.0000 mg | ORAL_TABLET | ORAL | Status: AC | PRN

## 2014-08-29 MED ORDER — MORPHINE SULFATE 4 MG/ML IJ SOLN
4.0000 mg | Freq: Once | INTRAMUSCULAR | Status: AC
  Administered 2014-08-29: 4 mg via INTRAVENOUS
  Filled 2014-08-29: qty 1

## 2014-08-29 MED ORDER — SODIUM CHLORIDE 0.9 % IV SOLN
1000.0000 mL | Freq: Once | INTRAVENOUS | Status: AC
  Administered 2014-08-29: 1000 mL via INTRAVENOUS

## 2014-08-29 NOTE — ED Provider Notes (Signed)
CSN: [id number]     Arrival date & time 08/29/14  1340 History   First MD Initiated Contact with Patient 08/29/14 1410     Chief Complaint  Patient presents with  . Abdominal Pain     (Consider location/radiation/quality/duration/timing/severity/associated sxs/prior Treatment) HPI UC flare up past week. Red, bloody diarrheal stool 15-20 day. No vomiting. No fever. Diffuse cramping abdominal pain. Daily prednisone 20mg , just increased from 5 mg/day a week ago for symptoms onset by GI, Dr. Ewing SchleinMagod. Last similar episode April. No h/o any bowel resection. This week discussion of starting Rem[employer]. 9/18 got injection of Simponi, temporary improvement.  Past Medical History  Diagnosis Date  . Irregular heart beat   . Ulcerative colitis   . PCOS (polycystic ovarian syndrome)   . Hypertension    Past Surgical History  Procedure Laterality Date  . Wisdom tooth extraction     Family History  Problem Relation Age of Onset  . Anesthesia problems Neg Hx    History  Substance Use Topics  . Smoking status: Former Smoker    Quit date: 11/28/2011  . Smokeless tobacco: Never Used  . Alcohol Use: Yes     Comment: occas., once a month or less.   OB History   Grav Para Term Preterm Abortions TAB SAB Ect Mult Living   0              Review of Systems 10 Systems reviewed and are negative for acute change except as noted in the HPI.   Allergies  Chocolate; Latex; and Nickel  Home Medications   Prior to Admission medications   Medication Sig Start Date End Date Taking? Authorizing Provider  escitalopram (LEXAPRO) 20 MG tablet Take 20 mg by mouth at bedtime.    Yes Historical Provider, MD  ferrous sulfate 325 (65 FE) MG tablet Take 325 mg by mouth daily with breakfast.   Yes Historical Provider, MD  golimumab (SIMPONI ARIA) 50 MG/4ML SOLN injection Inject 100 mg into the muscle every 28 (twenty-eight) days.   Yes Historical Provider, MD  loratadine (CLARITIN) 10 MG tablet Take 10 mg by  mouth daily as needed for allergies.    Yes Historical Provider, MD  Norgestimate-Ethinyl Estradiol Triphasic (TRINESSA, 28,) 0.18/0.215/0.25 MG-35 MCG tablet Take 1 tablet by mouth at bedtime.   Yes Historical Provider, MD  pantoprazole (PROTONIX) 40 MG tablet Take 40 mg by mouth at bedtime.    Yes Historical Provider, MD  predniSONE (DELTASONE) 20 MG tablet Take 20 mg by mouth daily with breakfast.    Yes Historical Provider, MD  traMADol (ULTRAM) 50 MG tablet Take 50 mg by mouth every 8 (eight) hours as needed for moderate pain. 12/23/13  Yes Penny Piarlando Vega, MD  traMADol (ULTRAM) 50 MG tablet Take 50 mg by mouth every 6 (six) hours as needed for moderate pain.   Yes Historical Provider, MD  vitamin C (ASCORBIC ACID) 500 MG tablet Take 500 mg by mouth every morning.   Yes Historical Provider, MD  ondansetron (ZOFRAN ODT) 4 MG disintegrating tablet Take 1 tablet (4 mg total) by mouth every 4 (four) hours as needed for nausea or vomiting. 08/29/14   Arby BarretteMarcy Asiel Chrostowski, MD  predniSONE (DELTASONE) 20 MG tablet 3 tabs po day one, then 2 tabs daily x 4 days 08/29/14   Arby BarretteMarcy Angline Schweigert, MD  traMADol (ULTRAM) 50 MG tablet Take 1 tablet (50 mg total) by mouth every 6 (six) hours as needed. 08/29/14   Arby BarretteMarcy Shavonne Ambroise, MD   BP 154/92  Pulse 96  Temp(Src) 98.1 F (36.7 C) (Oral)  Resp 16  SpO2 97%  LMP 08/06/2014 Physical Exam  Constitutional: She is oriented to person, place, and time.   Non toxic. Moderate pain. Normal mental status. Mildly pale.  HENT:  Head: Normocephalic and atraumatic.  Eyes: EOM are normal. Pupils are equal, round, and reactive to light.  Neck: Neck supple.  Cardiovascular: Normal rate, regular rhythm, normal heart sounds and intact distal pulses.   Pulmonary/Chest: Effort normal and breath sounds normal. No respiratory distress. She has no wheezes. She has no rales.  Abdominal: Soft. She exhibits no distension and no mass. Tenderness: generalized. There is no rebound and no guarding.   Musculoskeletal: Normal range of motion. She exhibits no edema and no tenderness.  Neurological: She is alert and oriented to person, place, and time. Coordination normal.  Skin: Skin is warm and dry. There is pallor.  Psychiatric: She has a normal mood and affect.    ED Course  Procedures (including critical care time) Labs Review Labs Reviewed  COMPREHENSIVE METABOLIC PANEL - Abnormal; Notable for the following:    Glucose, Bld 159 (*)    Albumin 3.1 (*)    Total Bilirubin <0.2 (*)    All other components within normal limits  LIPASE, BLOOD - Abnormal; Notable for the following:    Lipase 273 (*)    All other components within normal limits  CBC WITH DIFFERENTIAL - Abnormal; Notable for the following:    MCH 25.5 (*)    RDW 17.0 (*)    Platelets 483 (*)    Neutrophils Relative % 81 (*)    Lymphocytes Relative 11 (*)    All other components within normal limits  URINALYSIS, ROUTINE W REFLEX MICROSCOPIC - Abnormal; Notable for the following:    Hgb urine dipstick TRACE (*)    All other components within normal limits  APTT  PROTIME-INR  MAGNESIUM  PHOSPHORUS  URINE MICROSCOPIC-ADD ON  POC URINE PREG, ED    Imaging Review Ct Abdomen Pelvis W Contrast  08/29/2014   CLINICAL DATA:  History of ulcerative colitis presents with acute generalized abdominal pain and bright red blood per rectum for 1 week. Diarrhea.  EXAM: CT ABDOMEN AND PELVIS WITH CONTRAST  TECHNIQUE: Multidetector CT imaging of the abdomen and pelvis was performed using the standard protocol following bolus administration of intravenous contrast.  CONTRAST:  50mL OMNIPAQUE IOHEXOL 300 MG/ML SOLN, OMNIPAQUE IOHEXOL 300 MG/ML SOLN  COMPARISON:  03/02/2014 and 12/18/2013  FINDINGS: Lung bases are unremarkable.  Abdominal images demonstrating normal liver, spleen, pancreas, gallbladder and adrenal glands. Kidneys are normal in size with possible 2-3 mm stone over the mid pole collecting system of the right kidney.  No hydronephrosis. Appendix is normal. The previously noted mild colonic wall thickening has improved. There is mild decompression of the colon from the hepatic flexure distally to the rectum with subtle wall thickening compatible known ulcerative colitis. No significant inflammatory change or free fluid.  Pelvic images demonstrate a normal uterus, ovaries and bladder. Remainder the exam is unchanged.  IMPRESSION: Decompression of the colon with subtle wall thickening from the hepatic flexure to the rectum compatible with known ulcerative colitis. No mesenteric inflammation or free fluid.  Possible 2-3 mm stone over the mid pole collecting system of the right kidney.   Electronically Signed   By: Elberta Fortis M.D.   On: 08/29/2014 16:31     EKG Interpretation None      MDM  Final diagnoses:  Ulcerative colitis, with rectal bleeding   At this point I patient is experiencing some exacerbation of her underlying ulcerative colitis. CT scan however does show improvement relative to prior scans. She does not have leukocytosis or fever. And her hemoglobin is stable. I feel she is safe for continued outpatient management.     Arby Barrette, MD 08/29/14 [phone]

## 2014-08-29 NOTE — ED Notes (Signed)
Pt states hx of UC.  States that she has been having gen abd pain w/ BRB per rectum x 1 wk.  Denies vomiting but having diarrhea.

## 2014-08-29 NOTE — Discharge Instructions (Signed)
Ulcerative Colitis  Ulcerative colitis is a long lasting swelling and soreness (inflammation) of the colon (large intestine). In patients with ulcerative colitis, sores (ulcers) and inflammation of the inner lining of the colon lead to illness. Ulcerative colitis can also cause problems outside the digestive tract.   Ulcerative colitis is closely related to another condition of inflammation of the intestines called Crohn's disease. Together, they are frequently referred to as inflammatory bowel disease (IBD). Ulcerative colitis and Crohn's diseases are conditions that can last years to decades. Men and women are affected equally. They most commonly begin during adolescence and early adulthood.  SYMPTOMS   Common symptoms of ulcerative colitis include rectal bleeding and diarrhea. There is a wide range of symptoms among patients with this disease depending on how severe the disease is. Some of these symptoms are:  · Abdominal pain or cramping.  · Diarrhea.  · Fever.  · Tiredness (fatigue).  · Weight loss.  · Night sweats.  · Rectal pain.  · Feeling the immediate need to have a bowel movement (rectal urgency).  CAUSES   Ulcerative colitis is caused by increased activity of the immune system in the intestines. The immune system is the system that protects the body against disease such as harmful bacteria, viruses, fungi, and other foreign invaders. When the immune system overacts, it causes inflammation. The cause of the increased immune system activity is not known. This over activity causes long-lasting inflammation and ulceration. This condition may be passed down from your parents (inherited). Brothers, sisters, children, and parents of patients with IBD are more likely to develop these diseases. It is not contagious. This means you cannot catch it from someone else.  DIAGNOSIS   Your caregiver may suspect ulcerative colitis based on your symptoms and exam. Blood tests may confirm that there is a problem. You may  be asked to submit a stool specimen for examination. X-rays and CT scans may be necessary. Ultimately, the diagnosis is usually made after a flexible tube is inserted via your anus and your colon is examined under sedation (colonoscopy). With this test, the specialist can take a tiny tissue sample from inside the bowel (biopsy). Examination of this biopsy tissue under a microscopy can reveal ulcerative colitis as the cause of your symptoms.  TREATMENT   · There is no cure for ulcerative colitis.  · Complications such as massive bleeding from the colon (hemorrhage), development of a hole in the colon (perforation), or the development of precancerous or cancerous changes of the colon may require surgery.  · Medications are often used to decrease inflammation and control the immune system. These include medicines related to aspirin, steroid medications, and newer and stronger medications to slow down the immune system. Some medications may be used as suppositories or enemas. A number of other medications are used or have been studied. Your caregiver will make specific recommendations.  HOME CARE INSTRUCTIONS   · There is no cure for ulcerative colitis disease. The best treatment is frequent checkups with your caregiver. Periodic reevaluation is important.  · Symptoms such as diarrhea can be controlled with medications. Avoid foods that have a laxative effect such fresh fruit and vegetables and dairy products. During flare ups, you can rest your bowel by staying away from solid foods. Drink clear liquids frequently during the day. Electrolyte or rehydrating fluids are best. Your caregiver can help you with suggestions. Drink often to prevent dehydration. When diarrhea has cleared, eat smaller meals and more often. Avoid food additives   and stimulants such as caffeine (coffee, tea, many sodas, or chocolate). Avoid dairy products. Enzyme supplements may help if you develop intolerance to a sugar in dairy products  (lactose). Ask your caregiver or dietitian about specific dietary instructions.  · If you had surgery, be sure you understand your care instructions thoroughly, including proper care of any surgical wounds.  · Take any medications exactly as prescribed.  · Try to maintain a positive attitude. Learn relaxation techniques such as self hypnosis, mental imaging, and muscle relaxation. If possible, avoid stresses that aggravate your condition. Exercise regularly. Follow your diet. Always get plenty of rest.  SEEK MEDICAL CARE IF:   · Your symptoms fail to improve after a week or two of new treatment.  · You experience continued weight loss.  · You have ongoing crampy digestion or loose bowels.  · You develop a new skin rash, skin sores, or eye problems.  SEEK IMMEDIATE MEDICAL CARE IF:   · You have worsening of your symptoms or develop new symptoms.  · You have an oral temperature above 102° F (38.9° C), not controlled by medicine.  · You develop bloody diarrhea.  · You have severe abdominal pain.  Document Released: 08/23/2005 Document Revised: 02/05/2012 Document Reviewed: 07/23/2007  [employer]® Patient Information ©2015 [employer]. This information is not intended to replace advice given to you by your health care provider. Make sure you discuss any questions you have with your health care provider.

## 2014-08-29 NOTE — ED Notes (Signed)
Pt transported to CT ?

## 2014-08-29 NOTE — ED Notes (Signed)
MD at bedside. 

## 2014-10-25 ENCOUNTER — Encounter (HOSPITAL_COMMUNITY): Payer: Self-pay

## 2014-10-25 ENCOUNTER — Emergency Department (HOSPITAL_COMMUNITY)
Admission: EM | Admit: 2014-10-25 | Discharge: 2014-10-26 | Disposition: A | Discharge status: Home / Self Care | Payer: BC Managed Care – PPO | Attending: Emergency Medicine | Admitting: Emergency Medicine

## 2014-10-25 DIAGNOSIS — Z79899 Other long term (current) drug therapy: Secondary | ICD-10-CM | POA: Diagnosis not present

## 2014-10-25 DIAGNOSIS — K51911 Ulcerative colitis, unspecified with rectal bleeding: Secondary | ICD-10-CM | POA: Diagnosis not present

## 2014-10-25 DIAGNOSIS — I10 Essential (primary) hypertension: Secondary | ICD-10-CM | POA: Insufficient documentation

## 2014-10-25 DIAGNOSIS — Z7952 Long term (current) use of systemic steroids: Secondary | ICD-10-CM | POA: Diagnosis not present

## 2014-10-25 DIAGNOSIS — Z87891 Personal history of nicotine dependence: Secondary | ICD-10-CM | POA: Insufficient documentation

## 2014-10-25 DIAGNOSIS — M545 Low back pain, unspecified: Secondary | ICD-10-CM

## 2014-10-25 DIAGNOSIS — Z91040 Latex allergy status: Secondary | ICD-10-CM | POA: Diagnosis not present

## 2014-10-25 DIAGNOSIS — Z3202 Encounter for pregnancy test, result negative: Secondary | ICD-10-CM | POA: Diagnosis not present

## 2014-10-25 DIAGNOSIS — R109 Unspecified abdominal pain: Secondary | ICD-10-CM | POA: Diagnosis present

## 2014-10-25 DIAGNOSIS — Z8639 Personal history of other endocrine, nutritional and metabolic disease: Secondary | ICD-10-CM | POA: Diagnosis not present

## 2014-10-25 LAB — COMPREHENSIVE METABOLIC PANEL
ALBUMIN: 2.7 g/dL — AB (ref 3.5–5.2)
ALT: <5 U/L (ref 0–35)
ANION GAP: 14 (ref 5–15)
AST: 8 U/L (ref 0–37)
Alkaline Phosphatase: 79 U/L (ref 39–117)
BUN: 4 mg/dL — AB (ref 6–23)
CALCIUM: 9.1 mg/dL (ref 8.4–10.5)
CHLORIDE: 97 meq/L (ref 96–112)
CO2: 23 mEq/L (ref 19–32)
Creatinine, Ser: 0.69 mg/dL (ref 0.50–1.10)
GFR calc Af Amer: >90 mL/min (ref >90)
GFR calc non Af Amer: >90 mL/min (ref >90)
Glucose, Bld: 132 mg/dL — ABNORMAL HIGH (ref 70–99)
Potassium: 3.5 mEq/L — ABNORMAL LOW (ref 3.7–5.3)
Sodium: 134 mEq/L — ABNORMAL LOW (ref 137–147)
Total Protein: 7.4 g/dL (ref 6.0–8.3)

## 2014-10-25 LAB — CBC WITH DIFFERENTIAL/PLATELET
BASOS PCT: 0 % (ref 0–1)
Basophils Absolute: 0 10*3/uL (ref 0.0–0.1)
EOS ABS: 0.4 10*3/uL (ref 0.0–0.7)
EOS PCT: 5 % (ref 0–5)
HEMATOCRIT: 35.9 % — AB (ref 36.0–46.0)
Hemoglobin: 11.8 g/dL — ABNORMAL LOW (ref 12.0–15.0)
Lymphocytes Relative: 20 % (ref 12–46)
Lymphs Abs: 1.8 10*3/uL (ref 0.7–4.0)
MCH: 27.4 pg (ref 26.0–34.0)
MCHC: 32.9 g/dL (ref 30.0–36.0)
MCV: 83.5 fL (ref 78.0–100.0)
MONO ABS: 0.9 10*3/uL (ref 0.1–1.0)
MONOS PCT: 10 % (ref 3–12)
NEUTROS ABS: 5.7 10*3/uL (ref 1.7–7.7)
Neutrophils Relative %: 65 % (ref 43–77)
Platelets: 378 10*3/uL (ref 150–400)
RBC: 4.3 MIL/uL (ref 3.87–5.11)
RDW: 13.8 % (ref 11.5–15.5)
WBC: 8.9 10*3/uL (ref 4.0–10.5)

## 2014-10-25 NOTE — ED Notes (Signed)
Pt presents with c/o abdominal pain that has been going on for approx a week and a half. Pt has a hx of ulcerative colitis. Pt has had nausea and diarrhea but no vomiting. Pt also reporting some back pain, unsure of whether it is related or not, pain to her lower back. Ambulatory to triage.

## 2014-10-26 ENCOUNTER — Encounter (HOSPITAL_COMMUNITY): Payer: Self-pay

## 2014-10-26 ENCOUNTER — Emergency Department (HOSPITAL_COMMUNITY): Payer: BC Managed Care – PPO

## 2014-10-26 LAB — POC URINE PREG, ED: PREG TEST UR: NEGATIVE

## 2014-10-26 LAB — LIPASE, BLOOD: Lipase: 100 U/L — ABNORMAL HIGH (ref 11–59)

## 2014-10-26 LAB — URINALYSIS, ROUTINE W REFLEX MICROSCOPIC
Glucose, UA: NEGATIVE mg/dL
KETONES UR: NEGATIVE mg/dL
LEUKOCYTES UA: NEGATIVE
Nitrite: NEGATIVE
PROTEIN: 30 mg/dL — AB
Specific Gravity, Urine: 1.026 (ref 1.005–1.030)
Urobilinogen, UA: 0.2 mg/dL (ref 0.0–1.0)
pH: 6 (ref 5.0–8.0)

## 2014-10-26 LAB — URINE MICROSCOPIC-ADD ON

## 2014-10-26 MED ORDER — HYDROMORPHONE HCL 1 MG/ML IJ SOLN
1.0000 mg | Freq: Once | INTRAMUSCULAR | Status: AC
  Administered 2014-10-26: 1 mg via INTRAVENOUS
  Filled 2014-10-26: qty 1

## 2014-10-26 MED ORDER — ONDANSETRON HCL 4 MG/2ML IJ SOLN
4.0000 mg | INTRAMUSCULAR | Status: AC
  Administered 2014-10-26: 4 mg via INTRAVENOUS
  Filled 2014-10-26: qty 2

## 2014-10-26 MED ORDER — HYDROCODONE-ACETAMINOPHEN 5-325 MG PO TABS: 1.0000 | ORAL_TABLET | Freq: Four times a day (QID) | ORAL | Status: DC | PRN

## 2014-10-26 MED ORDER — IOHEXOL 300 MG/ML  SOLN
100.0000 mL | Freq: Once | INTRAMUSCULAR | Status: AC | PRN
  Administered 2014-10-26: 100 mL via INTRAVENOUS

## 2014-10-26 MED ORDER — SODIUM CHLORIDE 0.9 % IV BOLUS (SEPSIS)
1000.0000 mL | Freq: Once | INTRAVENOUS | Status: AC
Start: 2014-10-26 — End: 2014-10-26
  Administered 2014-10-26: 1000 mL via INTRAVENOUS

## 2014-10-26 MED ORDER — IOHEXOL 300 MG/ML  SOLN
50.0000 mL | Freq: Once | INTRAMUSCULAR | Status: AC | PRN
  Administered 2014-10-26: 50 mL via ORAL

## 2014-10-26 MED ORDER — DIAZEPAM 5 MG PO TABS: 5.0000 mg | ORAL_TABLET | Freq: Two times a day (BID) | ORAL | Status: DC | PRN

## 2014-10-26 MED ORDER — MORPHINE SULFATE 4 MG/ML IJ SOLN
4.0000 mg | Freq: Once | INTRAMUSCULAR | Status: AC
  Administered 2014-10-26: 4 mg via INTRAVENOUS
  Filled 2014-10-26: qty 1

## 2014-10-26 NOTE — ED Provider Notes (Signed)
CSN: [id number]     Arrival date & time 10/25/14  2017 History   First MD Initiated Contact with Patient 10/26/14 0144     Chief Complaint  Patient presents with  . Abdominal Pain  . Back Pain     (Consider location/radiation/quality/duration/timing/severity/associated sxs/prior Treatment) HPI Comments: Patient is a 33 year old female with a history of ulcerative colitis (on Rem[employer] infusions), PCOS, and hypertension. She presents to the emergency department for further evaluation of abdominal pain with associated left-sided back pain. Patient states that her pain in her abdomen is diffuse and aching. Her pain in her left back is aching as well as sharp. Back pain is nonradiating and pain in both areas are without modifying factors. Patient has taken Norco without improvement in her symptoms. She endorses body aches as well as low-grade temperature. She has had a fever to 101F. Associated symptoms also notable for hematochezia which patient states is consistent with her past ulcerative colitis flares. She denies chest pain, shortness of breath, vomiting, melena, vaginal bleeding or discharge, urinary complaints, and syncope. No history of abdominal surgeries.  GI - Dr. Macario GoldsMahgod  Patient is a 33 y.o. female presenting with abdominal pain and back pain. The history is provided by the patient. No language interpreter was used.  Abdominal Pain Associated symptoms: fever and nausea   Associated symptoms: no chest pain, no dysuria, no hematuria, no shortness of breath, no vaginal bleeding, no vaginal discharge and no vomiting   Back Pain Associated symptoms: abdominal pain and fever   Associated symptoms: no chest pain, no dysuria, no numbness and no weakness     Past Medical History  Diagnosis Date  . Irregular heart beat   . Ulcerative colitis   . PCOS (polycystic ovarian syndrome)   . Hypertension    Past Surgical History  Procedure Laterality Date  . Wisdom tooth extraction      Family History  Problem Relation Age of Onset  . Anesthesia problems Neg Hx    History  Substance Use Topics  . Smoking status: Former Smoker    Quit date: 11/28/2011  . Smokeless tobacco: Never Used  . Alcohol Use: Yes     Comment: occas., once a month or less.   OB History    Gravida Para Term Preterm AB TAB SAB Ectopic Multiple Living   0               Review of Systems  Constitutional: Positive for fever.  Respiratory: Negative for shortness of breath.   Cardiovascular: Negative for chest pain.  Gastrointestinal: Positive for nausea, abdominal pain and blood in stool. Negative for vomiting.  Genitourinary: Negative for dysuria, hematuria, vaginal bleeding and vaginal discharge.       Negative for incontinence  Musculoskeletal: Positive for back pain.  Neurological: Negative for syncope, weakness and numbness.    Allergies  Chocolate; Latex; and Nickel  Home Medications   Prior to Admission medications   Medication Sig Start Date End Date Taking? Authorizing Provider  budesonide (ENTOCORT EC) 3 MG 24 hr capsule Take 3 mg by mouth daily.   Yes Historical Provider, MD  cetirizine (ZYRTEC) 10 MG tablet Take 10 mg by mouth daily.   Yes Historical Provider, MD  clidinium-chlordiazePOXIDE (LIBRAX) 5-2.5 MG per capsule Take 1 capsule by mouth every 6 (six) hours as needed.   Yes Historical Provider, MD  escitalopram (LEXAPRO) 20 MG tablet Take 20 mg by mouth at bedtime.    Yes Historical Provider, MD  ferrous sulfate  325 (65 FE) MG tablet Take 325 mg by mouth daily with breakfast.   Yes Historical Provider, MD  inFLIXimab (REMICADE) 100 MG injection Inject 100 mg into the vein.   Yes Historical Provider, MD  Norgestimate-Ethinyl Estradiol Triphasic (TRINESSA, 28,) 0.18/0.215/0.25 MG-35 MCG tablet Take 1 tablet by mouth at bedtime.   Yes Historical Provider, MD  OVER THE COUNTER MEDICATION Take 1 capsule by mouth daily. Tumeric 500mg    Yes Historical Provider, MD   pantoprazole (PROTONIX) 40 MG tablet Take 40 mg by mouth at bedtime.    Yes Historical Provider, MD  vitamin C (ASCORBIC ACID) 500 MG tablet Take 500 mg by mouth every morning.   Yes Historical Provider, MD  diazepam (VALIUM) 5 MG tablet Take 1 tablet (5 mg total) by mouth every 12 (twelve) hours as needed for muscle spasms. 10/26/14   Antony Madura, PA-C  golimumab (SIMPONI ARIA) 50 MG/4ML SOLN injection Inject 100 mg into the muscle every 28 (twenty-eight) days.    Historical Provider, MD  HYDROcodone-acetaminophen (NORCO/VICODIN) 5-325 MG per tablet Take 1-2 tablets by mouth every 6 (six) hours as needed for moderate pain or severe pain. 10/26/14   Antony Madura, PA-C  loratadine (CLARITIN) 10 MG tablet Take 10 mg by mouth daily as needed for allergies.     Historical Provider, MD  ondansetron (ZOFRAN ODT) 4 MG disintegrating tablet Take 1 tablet (4 mg total) by mouth every 4 (four) hours as needed for nausea or vomiting. 08/29/14   Arby Barrette, MD  predniSONE (DELTASONE) 20 MG tablet Take 20 mg by mouth daily with breakfast.     Historical Provider, MD  predniSONE (DELTASONE) 20 MG tablet 3 tabs po day one, then 2 tabs daily x 4 days Patient not taking: Reported on 10/26/2014 08/29/14   Arby Barrette, MD  traMADol (ULTRAM) 50 MG tablet Take 50 mg by mouth every 8 (eight) hours as needed for moderate pain. 12/23/13   Penny Pia, MD  traMADol (ULTRAM) 50 MG tablet Take 50 mg by mouth every 6 (six) hours as needed for moderate pain.    Historical Provider, MD  traMADol (ULTRAM) 50 MG tablet Take 1 tablet (50 mg total) by mouth every 6 (six) hours as needed. 08/29/14   Arby Barrette, MD   BP 120/68 mmHg  Pulse 80  Temp(Src) 99.2 F (37.3 C) (Oral)  Resp 18  SpO2 100%  LMP 10/04/2014   Physical Exam  Constitutional: She is oriented to person, place, and time. She appears well-developed and well-nourished. No distress.  Patient appears fatigued. She does not appear toxic or septic.  HENT:   Head: Normocephalic and atraumatic.  Eyes: Conjunctivae and EOM are normal. No scleral icterus.  Neck: Normal range of motion.  Cardiovascular: Normal rate, regular rhythm and normal heart sounds.   Pulmonary/Chest: Effort normal. No respiratory distress. She has no wheezes. She has no rales.  Chest expansion symmetric. Lungs clear bilaterally.  Abdominal: Soft. Bowel sounds are normal. She exhibits no distension. There is tenderness. There is no rebound and no guarding.  Abdomen soft with generalized tenderness. No masses or peritoneal signs.  Musculoskeletal: Normal range of motion. She exhibits tenderness.  Tenderness to palpation of left lumbar paraspinal muscles. No bony deformities, step-offs, or crepitus to lumbar midline  Neurological: She is alert and oriented to person, place, and time. She exhibits normal muscle tone. Coordination normal.  Skin: Skin is warm and dry. No rash noted. She is not diaphoretic. No erythema. No pallor.  Psychiatric: She  has a normal mood and affect. Her behavior is normal.  Nursing note and vitals reviewed.   ED Course  Procedures (including critical care time) Labs Review Labs Reviewed  CBC WITH DIFFERENTIAL - Abnormal; Notable for the following:    Hemoglobin 11.8 (*)    HCT 35.9 (*)    All other components within normal limits  COMPREHENSIVE METABOLIC PANEL - Abnormal; Notable for the following:    Sodium 134 (*)    Potassium 3.5 (*)    Glucose, Bld 132 (*)    BUN 4 (*)    Albumin 2.7 (*)    Total Bilirubin <0.2 (*)    All other components within normal limits  URINALYSIS, ROUTINE W REFLEX MICROSCOPIC - Abnormal; Notable for the following:    Color, Urine AMBER (*)    APPearance CLOUDY (*)    Hgb urine dipstick SMALL (*)    Bilirubin Urine SMALL (*)    Protein, ur 30 (*)    All other components within normal limits  LIPASE, BLOOD - Abnormal; Notable for the following:    Lipase 100 (*)    All other components within normal limits   URINE MICROSCOPIC-ADD ON - Abnormal; Notable for the following:    Squamous Epithelial / LPF MANY (*)    All other components within normal limits  POC URINE PREG, ED    Imaging Review Ct Abdomen Pelvis W Contrast  10/26/2014   CLINICAL DATA:  Acute onset of generalized abdominal and lower back pain. Elevated lipase. Initial encounter.  EXAM: CT ABDOMEN AND PELVIS WITH CONTRAST  TECHNIQUE: Multidetector CT imaging of the abdomen and pelvis was performed using the standard protocol following bolus administration of intravenous contrast.  CONTRAST:  100mL OMNIPAQUE IOHEXOL 300 MG/ML  SOLN  COMPARISON:  CT of the abdomen and pelvis from 08/29/2014  FINDINGS: Minimal bibasilar atelectasis is noted.  The liver and spleen are unremarkable in appearance. The gallbladder is within normal limits. The pancreas and adrenal glands are unremarkable.  A few tiny bilateral renal stones are seen, measuring up to 2 mm in size. The kidneys are otherwise unremarkable. There is no evidence of hydronephrosis. No obstructing ureteral stones are seen. No perinephric stranding is appreciated.  No free fluid is identified. The small bowel is unremarkable in appearance. The stomach is within normal limits. No acute vascular abnormalities are seen.  The appendix is borderline normal in caliber, without definite evidence of appendicitis. Scattered pericecal nodes are borderline normal in size, and relatively stable from the prior study. There is suggestion of mild wall thickening along the entirety of the colon, which is perhaps slightly more prominent than on the prior study and may reflect the patient's history of ulcerative colitis. There is slightly increased prominence of the surrounding vasculature.  Associated mildly prominent nodes are noted about the descending and proximal sigmoid colon, more prominent than on the prior study.  The bladder is mildly distended and grossly unremarkable. The uterus is within normal limits.  The ovaries are relatively symmetric. No suspicious adnexal masses are seen. No inguinal lymphadenopathy is seen.  No acute osseous abnormalities are identified.  IMPRESSION: 1. Suggestion of mild wall thickening along the entirety of the colon, which is perhaps slightly more prominent than on the prior study. Mildly prominent surrounding nodes also seen about the descending and proximal sigmoid colon. This may reflect mild acute exacerbation of the patient's known ulcerative colitis. 2. Few tiny bilateral nonobstructing renal stones seen, measuring up to 2 mm in size.  No evidence of hydronephrosis.   Electronically Signed   By: Roanna Raider M.D.   On: 10/26/2014 03:50     EKG Interpretation None      MDM   Final diagnoses:  Abdominal pain  Ulcerative colitis, with rectal bleeding  Left-sided low back pain without sciatica    33 year old female with a history of ulcerative colitis presents to the emergency department for further evaluation of abdominal pain and back pain. Abdomen with general tenderness on palpation without peritoneal signs or guarding. No masses appreciated. She is also noted to have tenderness to her lumbar paraspinal muscles on the left. Patient neurovascularly intact. No red flags or signs concerning for cauda equina.  Abdominal CT ordered for further evaluation of abdominal pain. This shows no evidence of abscess or other emergent abdominal pelvic process. Wall thickening seems fairly consistent with prior studies. Nonobstructing renal calculi also seen. Labs at baseline.  Patient treated with pain medication with improvement in her symptoms. Suspect that back pain is musculoskeletal in nature rather than related to her ulcerative colitis. Patient advised to take Norco as needed for pain control and follow-up with her gastroenterologist as well as her primary care doctor. Return precautions discussed and provided. Patient agreeable to plan with no unaddressed  concerns.   Filed Vitals:   10/26/14 0130 10/26/14 0142 10/26/14 0330 10/26/14 0509  BP: 152/84  146/71 120/68  Pulse: 76  83 80  Temp:      TempSrc:      Resp: 18  18 18   SpO2: 100% 98% 100% 100%       Antony Madura, PA-C 10/26/14 0559  Vida Roller, MD 10/26/14 [phone]

## 2014-10-26 NOTE — ED Notes (Signed)
Awake. Verbally responsive. A/O x4. Resp even and unlabored. No audible adventitious breath sounds noted. ABC's intact. No N/V/D noted at this time. Family at beside.

## 2014-10-26 NOTE — ED Notes (Signed)
Patient transported to CT 

## 2014-10-26 NOTE — Discharge Instructions (Signed)
Ulcerative Colitis Ulcerative colitis is a long lasting swelling and soreness (inflammation) of the colon (large intestine). In patients with ulcerative colitis, sores (ulcers) and inflammation of the inner lining of the colon lead to illness. Ulcerative colitis can also cause problems outside the digestive tract.  Ulcerative colitis is closely related to another condition of inflammation of the intestines called Crohn's disease. Together, they are frequently referred to as inflammatory bowel disease (IBD). Ulcerative colitis and Crohn's diseases are conditions that can last years to decades. Men and women are affected equally. They most commonly begin during adolescence and early adulthood. SYMPTOMS  Common symptoms of ulcerative colitis include rectal bleeding and diarrhea. There is a wide range of symptoms among patients with this disease depending on how severe the disease is. Some of these symptoms are:  Abdominal pain or cramping.  Diarrhea.  Fever.  Tiredness (fatigue).  Weight loss.  Night sweats.  Rectal pain.  Feeling the immediate need to have a bowel movement (rectal urgency). CAUSES  Ulcerative colitis is caused by increased activity of the immune system in the intestines. The immune system is the system that protects the body against disease such as harmful bacteria, viruses, fungi, and other foreign invaders. When the immune system overacts, it causes inflammation. The cause of the increased immune system activity is not known. This over activity causes long-lasting inflammation and ulceration. This condition may be passed down from your parents (inherited). Brothers, sisters, children, and parents of patients with IBD are more likely to develop these diseases. It is not contagious. This means you cannot catch it from someone else. DIAGNOSIS  Your caregiver may suspect ulcerative colitis based on your symptoms and exam. Blood tests may confirm that there is a problem. You may  be asked to submit a stool specimen for examination. X-rays and CT scans may be necessary. Ultimately, the diagnosis is usually made after a flexible tube is inserted via your anus and your colon is examined under sedation (colonoscopy). With this test, the specialist can take a tiny tissue sample from inside the bowel (biopsy). Examination of this biopsy tissue under a microscopy can reveal ulcerative colitis as the cause of your symptoms. TREATMENT   There is no cure for ulcerative colitis.  Complications such as massive bleeding from the colon (hemorrhage), development of a hole in the colon (perforation), or the development of precancerous or cancerous changes of the colon may require surgery.  Medications are often used to decrease inflammation and control the immune system. These include medicines related to aspirin, steroid medications, and newer and stronger medications to slow down the immune system. Some medications may be used as suppositories or enemas. A number of other medications are used or have been studied. Your caregiver will make specific recommendations. HOME CARE INSTRUCTIONS   There is no cure for ulcerative colitis disease. The best treatment is frequent checkups with your caregiver. Periodic reevaluation is important.  Symptoms such as diarrhea can be controlled with medications. Avoid foods that have a laxative effect such fresh fruit and vegetables and dairy products. During flare ups, you can rest your bowel by staying away from solid foods. Drink clear liquids frequently during the day. Electrolyte or rehydrating fluids are best. Your caregiver can help you with suggestions. Drink often to prevent dehydration. When diarrhea has cleared, eat smaller meals and more often. Avoid food additives and stimulants such as caffeine (coffee, tea, many sodas, or chocolate). Avoid dairy products. Enzyme supplements may help if you develop intolerance to  a sugar in dairy products  (lactose). Ask your caregiver or dietitian about specific dietary instructions.  If you had surgery, be sure you understand your care instructions thoroughly, including proper care of any surgical wounds.  Take any medications exactly as prescribed.  Try to maintain a positive attitude. Learn relaxation techniques such as self hypnosis, mental imaging, and muscle relaxation. If possible, avoid stresses that aggravate your condition. Exercise regularly. Follow your diet. Always get plenty of rest. SEEK MEDICAL CARE IF:   Your symptoms fail to improve after a week or two of new treatment.  You experience continued weight loss.  You have ongoing crampy digestion or loose bowels.  You develop a new skin rash, skin sores, or eye problems. SEEK IMMEDIATE MEDICAL CARE IF:   You have worsening of your symptoms or develop new symptoms.  You have an oral temperature above 102 F (38.9 C), not controlled by medicine.  You develop bloody diarrhea.  You have severe abdominal pain. Document Released: 08/23/2005 Document Revised: 02/05/2012 Document Reviewed: 07/23/2007 [facility] Patient Information 2015 [place], [place]. This information is not intended to replace advice given to you by your health care provider. Make sure you discuss any questions you have with your health care provider. Back Pain, Adult Low back pain is very common. About 1 in 5 people have back pain.The cause of low back pain is rarely dangerous. The pain often gets better over time.About half of people with a sudden onset of back pain feel better in just 2 weeks. About 8 in 10 people feel better by 6 weeks.  CAUSES Some common causes of back pain include:  Strain of the muscles or ligaments supporting the spine.  Wear and tear (degeneration) of the spinal discs.  Arthritis.  Direct injury to the back. DIAGNOSIS Most of the time, the direct cause of low back pain is not known.However, back pain can be treated  effectively even when the exact cause of the pain is unknown.Answering your caregiver's questions about your overall health and symptoms is one of the most accurate ways to make sure the cause of your pain is not dangerous. If your caregiver needs more information, he or she may order lab work or imaging tests (X-rays or MRIs).However, even if imaging tests show changes in your back, this usually does not require surgery. HOME CARE INSTRUCTIONS For many people, back pain returns.Since low back pain is rarely dangerous, it is often a condition that people can learn to [facility] LLCmanageon their own.   Remain active. It is stressful on the back to sit or stand in one place. Do not sit, drive, or stand in one place for more than 30 minutes at a time. Take short walks on level surfaces as soon as pain allows.Try to increase the length of time you walk each day.  Do not stay in bed.Resting more than 1 or 2 days can delay your recovery.  Do not avoid exercise or work.Your body is made to move.It is not dangerous to be active, even though your back may hurt.Your back will likely heal faster if you return to being active before your pain is gone.  Pay attention to your body when you bend and lift. Many people have less discomfortwhen lifting if they bend their knees, keep the load close to their bodies,and avoid twisting. Often, the most comfortable positions are those that put less stress on your recovering back.  Find a comfortable position to sleep. Use a firm mattress and lie on your  side with your knees slightly bent. If you lie on your back, put a pillow under your knees.  Only take over-the-counter or prescription medicines as directed by your caregiver. Over-the-counter medicines to reduce pain and inflammation are often the most helpful.Your caregiver may prescribe muscle relaxant drugs.These medicines help dull your pain so you can more quickly return to your normal activities and healthy  exercise.  Put ice on the injured area.  Put ice in a plastic bag.  Place a towel between your skin and the bag.  Leave the ice on for 15-20 minutes, 03-04 times a day for the first 2 to 3 days. After that, ice and heat may be alternated to reduce pain and spasms.  Ask your caregiver about trying back exercises and gentle massage. This may be of some benefit.  Avoid feeling anxious or stressed.Stress increases muscle tension and can worsen back pain.It is important to recognize when you are anxious or stressed and learn ways to manage it.Exercise is a great option. SEEK MEDICAL CARE IF:  You have pain that is not relieved with rest or medicine.  You have pain that does not improve in 1 week.  You have new symptoms.  You are generally not feeling well. SEEK IMMEDIATE MEDICAL CARE IF:   You have pain that radiates from your back into your legs.  You develop new bowel or bladder control problems.  You have unusual weakness or numbness in your arms or legs.  You develop nausea or vomiting.  You develop abdominal pain.  You feel faint. Document Released: 11/13/2005 Document Revised: 05/14/2012 Document Reviewed: 03/17/2014 [facility]ExitCare Patient Information 2015 [place]Exit[place], [place]. This information is not intended to replace advice given to you by your health care provider. Make sure you discuss any questions you have with your health care provider.

## 2014-10-26 NOTE — ED Notes (Signed)
Awake. Verbally responsive. A/O x4. Resp even and unlabored. No audible adventitious breath sounds noted. ABC's intact. Abd soft/ tender/ nondistended. BS(+) x4 quadrants. (+) nausea. Denies emesis. Pt reported diarrhea x15 in 24hrs.

## 2014-10-26 NOTE — ED Notes (Signed)
Awake. Verbally responsive. A/O x4. Resp even and unlabored. No audible adventitious breath sounds noted. ABC's intact.  

## 2014-10-26 NOTE — ED Notes (Signed)
Resting quietly with eye closed. Easily arousable. Verbally responsive. Resp even and unlabored. ABC's intact. NAD noted. Family at bedside.  

## 2014-10-26 NOTE — ED Notes (Signed)
Pt awaken for pain score and reported pain 8/10. Pt immideately fell back to sleep after giving score.

## 2014-10-26 NOTE — ED Provider Notes (Signed)
33 year old female, history of ulcerative colitis, history of recently being started on Remicade, has been on high doses of prednisone in the past, has had some fluctuating abdominal pain with rectal bleeding and fevers over the last 10 days. On exam the patient has mild diffuse abdominal pain without focality, guarding or peritoneal signs. She has also had some low back pain, no obvious swelling redness or rashes. She is neurologically intact, will need imaging to rule out intra-abdominal abscess.  Medical screening examination/treatment/procedure(s) were conducted as a shared visit with non-physician practitioner(s) and myself.  I personally evaluated the patient during the encounter.  Clinical Impression:   Final diagnoses:  Abdominal pain  Ulcerative colitis, with rectal bleeding  Left-sided low back pain without sciatica         Vida RollerBrian D Tayjon Halladay, MD 10/26/14 [phone]

## 2014-11-22 ENCOUNTER — Inpatient Hospital Stay (HOSPITAL_COMMUNITY)
Admission: EM | Admit: 2014-11-22 | Discharge: 2014-12-03 | DRG: 386 | Payer: No Typology Code available for payment source | Attending: Internal Medicine | Admitting: Internal Medicine

## 2014-11-22 ENCOUNTER — Encounter (HOSPITAL_COMMUNITY): Payer: Self-pay | Admitting: Emergency Medicine

## 2014-11-22 ENCOUNTER — Emergency Department (HOSPITAL_COMMUNITY): Payer: No Typology Code available for payment source

## 2014-11-22 DIAGNOSIS — D62 Acute posthemorrhagic anemia: Secondary | ICD-10-CM | POA: Diagnosis present

## 2014-11-22 DIAGNOSIS — Z91018 Allergy to other foods: Secondary | ICD-10-CM

## 2014-11-22 DIAGNOSIS — I10 Essential (primary) hypertension: Secondary | ICD-10-CM | POA: Diagnosis present

## 2014-11-22 DIAGNOSIS — E282 Polycystic ovarian syndrome: Secondary | ICD-10-CM | POA: Diagnosis present

## 2014-11-22 DIAGNOSIS — R109 Unspecified abdominal pain: Secondary | ICD-10-CM

## 2014-11-22 DIAGNOSIS — Z91040 Latex allergy status: Secondary | ICD-10-CM

## 2014-11-22 DIAGNOSIS — Z87891 Personal history of nicotine dependence: Secondary | ICD-10-CM

## 2014-11-22 DIAGNOSIS — K5190 Ulcerative colitis, unspecified, without complications: Principal | ICD-10-CM | POA: Diagnosis present

## 2014-11-22 DIAGNOSIS — F419 Anxiety disorder, unspecified: Secondary | ICD-10-CM | POA: Diagnosis present

## 2014-11-22 LAB — CBC WITH DIFFERENTIAL/PLATELET
Basophils Absolute: 0 10*3/uL (ref 0.0–0.1)
Basophils Relative: 1 % (ref 0–1)
Eosinophils Absolute: 0.3 10*3/uL (ref 0.0–0.7)
Eosinophils Relative: 5 % (ref 0–5)
HCT: 37.2 % (ref 36.0–46.0)
Hemoglobin: 11.5 g/dL — ABNORMAL LOW (ref 12.0–15.0)
Lymphocytes Relative: 14 % (ref 12–46)
Lymphs Abs: 0.9 10*3/uL (ref 0.7–4.0)
MCH: 26.3 pg (ref 26.0–34.0)
MCHC: 30.9 g/dL (ref 30.0–36.0)
MCV: 85.1 fL (ref 78.0–100.0)
Monocytes Absolute: 0.9 10*3/uL (ref 0.1–1.0)
Monocytes Relative: 14 % — ABNORMAL HIGH (ref 3–12)
Neutro Abs: 4.4 10*3/uL (ref 1.7–7.7)
Neutrophils Relative %: 66 % (ref 43–77)
Platelets: 385 10*3/uL (ref 150–400)
RBC: 4.37 MIL/uL (ref 3.87–5.11)
RDW: 13.3 % (ref 11.5–15.5)
WBC: 6.7 10*3/uL (ref 4.0–10.5)

## 2014-11-22 LAB — COMPREHENSIVE METABOLIC PANEL
ALT: 7 U/L (ref 0–35)
AST: 15 U/L (ref 0–37)
Albumin: 3.1 g/dL — ABNORMAL LOW (ref 3.5–5.2)
Alkaline Phosphatase: 53 U/L (ref 39–117)
Anion gap: 7 (ref 5–15)
BUN: <5 mg/dL — ABNORMAL LOW (ref 6–23)
CO2: 25 mmol/L (ref 19–32)
Calcium: 8.8 mg/dL (ref 8.4–10.5)
Chloride: 102 mEq/L (ref 96–112)
Creatinine, Ser: 0.7 mg/dL (ref 0.50–1.10)
GFR calc Af Amer: >90 mL/min (ref >90)
GFR calc non Af Amer: >90 mL/min (ref >90)
Glucose, Bld: 134 mg/dL — ABNORMAL HIGH (ref 70–99)
Potassium: 3.7 mmol/L (ref 3.5–5.1)
Sodium: 134 mmol/L — ABNORMAL LOW (ref 135–145)
Total Bilirubin: 0.3 mg/dL (ref 0.3–1.2)
Total Protein: 7.4 g/dL (ref 6.0–8.3)

## 2014-11-22 LAB — URINALYSIS, ROUTINE W REFLEX MICROSCOPIC
Bilirubin Urine: NEGATIVE
Glucose, UA: NEGATIVE mg/dL
Ketones, ur: NEGATIVE mg/dL
Leukocytes, UA: NEGATIVE
Nitrite: NEGATIVE
Protein, ur: NEGATIVE mg/dL
Specific Gravity, Urine: 1.008 (ref 1.005–1.030)
Urobilinogen, UA: 0.2 mg/dL (ref 0.0–1.0)
pH: 6.5 (ref 5.0–8.0)

## 2014-11-22 LAB — URINE MICROSCOPIC-ADD ON

## 2014-11-22 LAB — LIPASE, BLOOD: Lipase: 23 U/L (ref 11–59)

## 2014-11-22 MED ORDER — ONDANSETRON HCL 4 MG/2ML IJ SOLN
4.0000 mg | Freq: Once | INTRAMUSCULAR | Status: AC
  Administered 2014-11-22: 4 mg via INTRAVENOUS
  Filled 2014-11-22: qty 2

## 2014-11-22 MED ORDER — SODIUM CHLORIDE 0.9 % IV BOLUS (SEPSIS)
1000.0000 mL | Freq: Once | INTRAVENOUS | Status: AC
  Administered 2014-11-22: 1000 mL via INTRAVENOUS

## 2014-11-22 MED ORDER — HYDROMORPHONE HCL 1 MG/ML IJ SOLN
1.0000 mg | Freq: Once | INTRAMUSCULAR | Status: AC
  Administered 2014-11-22: 1 mg via INTRAVENOUS
  Filled 2014-11-22: qty 1

## 2014-11-22 MED ORDER — IOHEXOL 300 MG/ML  SOLN
50.0000 mL | Freq: Once | INTRAMUSCULAR | Status: AC | PRN
  Administered 2014-11-22: 50 mL via ORAL

## 2014-11-22 NOTE — ED Notes (Signed)
Pt reports abdominal pain, diarrhea with bright red blood in stool, nausea and temp of 103.1 at home. Spoke with GI MD and told to come to ED for temp. Tylenol last taken at 1830. Pt a&ox4, skin warm and dry, ambulatory to triage.

## 2014-11-22 NOTE — ED Provider Notes (Signed)
CSN: [id number]     Arrival date & time 11/22/14  2051 History   First MD Initiated Contact with Patient 11/22/14 2213     Chief Complaint  Patient presents with  . Abdominal Pain     (Consider location/radiation/quality/duration/timing/severity/associated sxs/prior Treatment) HPI Kathleen Mcdonald is a 33 y.o. female with hx of UC who presents to ED with complaint of abdominal pain. Pt states pain started 1 week ago. Worsening nausea, diarrhea, blood in stool. Feels like prior UC flare. States today she started running a fever. Temp up to 103. Reports worsening pain, unable to control it with vicodin at home. States she is not eating or drinking. She states she is currently on Remicade for her UC, last injection was December 3. She receives them every 6 weeks. States also takes budesonide 3 mg daily. Her gastroenterologist is Dr. Darlen RoundMagot. States nothing making her symptoms better or worse. Patient denies any other complaints including no upper respiratory symptoms, no urinary symptoms, no vaginal discharge or bleeding.  Past Medical History  Diagnosis Date  . Irregular heart beat   . Ulcerative colitis   . PCOS (polycystic ovarian syndrome)   . Hypertension    Past Surgical History  Procedure Laterality Date  . Wisdom tooth extraction     Family History  Problem Relation Age of Onset  . Anesthesia problems Neg Hx    History  Substance Use Topics  . Smoking status: Former Smoker    Quit date: 11/28/2011  . Smokeless tobacco: Never Used  . Alcohol Use: Yes     Comment: occas., once a month or less.   OB History    Gravida Para Term Preterm AB TAB SAB Ectopic Multiple Living   0              Review of Systems  Constitutional: Positive for fever and chills.  Respiratory: Negative for cough, chest tightness and shortness of breath.   Cardiovascular: Negative for chest pain, palpitations and leg swelling.  Gastrointestinal: Positive for nausea, abdominal pain, diarrhea and blood  in stool. Negative for vomiting.  Genitourinary: Negative for dysuria, flank pain and pelvic pain.  Musculoskeletal: Negative for myalgias, arthralgias, neck pain and neck stiffness.  Skin: Negative for rash.  Neurological: Positive for dizziness and light-headedness. Negative for weakness and headaches.  All other systems reviewed and are negative.     Allergies  Chocolate; Latex; and Nickel  Home Medications   Prior to Admission medications   Medication Sig Start Date End Date Taking? Authorizing Provider  budesonide (ENTOCORT EC) 3 MG 24 hr capsule Take 3 mg by mouth daily.   Yes Historical Provider, MD  clidinium-chlordiazePOXIDE (LIBRAX) 5-2.5 MG per capsule Take 1 capsule by mouth every 6 (six) hours as needed.   Yes Historical Provider, MD  diazepam (VALIUM) 5 MG tablet Take 1 tablet (5 mg total) by mouth every 12 (twelve) hours as needed for muscle spasms. 10/26/14  Yes Antony MaduraKelly Humes, PA-C  escitalopram (LEXAPRO) 20 MG tablet Take 20 mg by mouth at bedtime.    Yes Historical Provider, MD  HYDROcodone-acetaminophen (NORCO/VICODIN) 5-325 MG per tablet Take 1-2 tablets by mouth every 6 (six) hours as needed for moderate pain or severe pain. 10/26/14  Yes Antony MaduraKelly Humes, PA-C  Norgestimate-Ethinyl Estradiol Triphasic (TRINESSA, 28,) 0.18/0.215/0.25 MG-35 MCG tablet Take 1 tablet by mouth at bedtime.   Yes Historical Provider, MD  pantoprazole (PROTONIX) 40 MG tablet Take 40 mg by mouth at bedtime.    Yes Historical Provider,  MD  vitamin C (ASCORBIC ACID) 500 MG tablet Take 500 mg by mouth every morning.   Yes Historical Provider, MD  cetirizine (ZYRTEC) 10 MG tablet Take 10 mg by mouth daily as needed for allergies.     Historical Provider, MD  ferrous sulfate 325 (65 FE) MG tablet Take 325 mg by mouth daily with breakfast.    Historical Provider, MD  golimumab (SIMPONI ARIA) 50 MG/4ML SOLN injection Inject 100 mg into the muscle every 28 (twenty-eight) days.    Historical Provider, MD   inFLIXimab (REMICADE) 100 MG injection Inject 100 mg into the vein.    Historical Provider, MD  loratadine (CLARITIN) 10 MG tablet Take 10 mg by mouth daily as needed for allergies.     Historical Provider, MD  ondansetron (ZOFRAN ODT) 4 MG disintegrating tablet Take 1 tablet (4 mg total) by mouth every 4 (four) hours as needed for nausea or vomiting. 08/29/14   Arby Barrette, MD  OVER THE COUNTER MEDICATION Take 1 capsule by mouth daily. Tumeric 500mg     Historical Provider, MD  predniSONE (DELTASONE) 20 MG tablet Take 20 mg by mouth daily with breakfast.     Historical Provider, MD  predniSONE (DELTASONE) 20 MG tablet 3 tabs po day one, then 2 tabs daily x 4 days Patient not taking: Reported on 10/26/2014 08/29/14   Arby Barrette, MD  traMADol (ULTRAM) 50 MG tablet Take 50 mg by mouth every 8 (eight) hours as needed for moderate pain. 12/23/13   Penny Pia, MD  traMADol (ULTRAM) 50 MG tablet Take 50 mg by mouth every 6 (six) hours as needed for moderate pain.    Historical Provider, MD  traMADol (ULTRAM) 50 MG tablet Take 1 tablet (50 mg total) by mouth every 6 (six) hours as needed. 08/29/14   Arby Barrette, MD   BP 139/82 mmHg  Pulse 113  Temp(Src) 101.1 F (38.4 C) (Rectal)  Resp 18  Ht 5\' 3"  (1.6 m)  Wt 169 lb (76.658 kg)  BMI 29.94 kg/m2  SpO2 100%  LMP 11/01/2014 (Approximate) Physical Exam  Constitutional: She is oriented to person, place, and time. She appears well-developed and well-nourished.  Uncomfortable appearing  HENT:  Head: Normocephalic.  Eyes: Conjunctivae are normal.  Neck: Neck supple.  Cardiovascular: Normal rate, regular rhythm and normal heart sounds.   Pulmonary/Chest: Effort normal and breath sounds normal. No respiratory distress. She has no wheezes. She has no rales.  Abdominal: Soft. Bowel sounds are normal. She exhibits no distension. There is tenderness. There is no rebound.  Diffuse tenderness, some guarding  Musculoskeletal: She exhibits no edema.   Neurological: She is alert and oriented to person, place, and time.  Skin: Skin is warm and dry.  Psychiatric: She has a normal mood and affect. Her behavior is normal.  Nursing note and vitals reviewed.   ED Course  Procedures (including critical care time) Labs Review Labs Reviewed  CBC WITH DIFFERENTIAL - Abnormal; Notable for the following:    Hemoglobin 11.5 (*)    Monocytes Relative 14 (*)    All other components within normal limits  COMPREHENSIVE METABOLIC PANEL - Abnormal; Notable for the following:    Sodium 134 (*)    Glucose, Bld 134 (*)    BUN <5 (*)    Albumin 3.1 (*)    All other components within normal limits  URINALYSIS, ROUTINE W REFLEX MICROSCOPIC - Abnormal; Notable for the following:    Hgb urine dipstick TRACE (*)    All  other components within normal limits  LIPASE, BLOOD  URINE MICROSCOPIC-ADD ON  PREGNANCY, URINE    Imaging Review No results found.   EKG Interpretation None      MDM   Final diagnoses:  Abdominal pain   patient here with UC flare, abdominal pain, fever, diarrhea, blood in stool. Temperatures 101.1. Tylenol, IV fluids, pain medications and antiemetics ordered. Lab work pending.  Labs showing normal white count, unremarkable electrolytes, UA negative. Given patient's increasing pain, diffuse tenderness with some guarding, will get a CT scan.   1:33 AM Patient's pain is improving with pain medications and IV fluids. CT scan showing worsening wall thickening and inflammatory changes in the colon from hepatic flexure to the rectosigmoid colon. Discussed results with patient. Patient states she is worried about going home with a fever and worsening pain. Will culture and for admission.   Spoke with triad, will admit.   Filed Vitals:   11/22/14 2102 11/22/14 2328 11/23/14 0038  BP: 139/82  112/65  Pulse: 113  88  Temp: 98.7 F (37.1 C) 101.1 F (38.4 C)   TempSrc: Oral Rectal   Resp: 18  18  Height: 5\' 3"  (1.6 m)     Weight: 169 lb (76.658 kg)    SpO2: 100%  93%     Lottie Musselatyana A Akeema Broder, PA-C 11/23/14 0144  Raeford RazorStephen Kohut, MD 11/23/14 [phone]

## 2014-11-22 NOTE — ED Notes (Signed)
Awake. Verbally responsive. A/O x4. Resp even and unlabored. No audible adventitious breath sounds noted. ABC's intact. Pt reported abd pain with LBM 11/22/2014 of diarrhea consistency. Diarrhea x20 in past 24 hrs. Reported nausea without vomiting. BS (+) and hyperactive x4 quadrants. Abd soft/nondistended/nontender. Family at bedside.

## 2014-11-22 NOTE — ED Notes (Signed)
Awake. Verbally responsive. A/O x4. Resp even and unlabored. No audible adventitious breath sounds noted. ABC's intact. IV infusing NS at 999ml/hr without difficulty. 

## 2014-11-23 ENCOUNTER — Encounter (HOSPITAL_COMMUNITY): Payer: Self-pay

## 2014-11-23 ENCOUNTER — Emergency Department (HOSPITAL_COMMUNITY): Payer: No Typology Code available for payment source

## 2014-11-23 DIAGNOSIS — Z91018 Allergy to other foods: Secondary | ICD-10-CM | POA: Diagnosis not present

## 2014-11-23 DIAGNOSIS — F419 Anxiety disorder, unspecified: Secondary | ICD-10-CM | POA: Diagnosis present

## 2014-11-23 DIAGNOSIS — D62 Acute posthemorrhagic anemia: Secondary | ICD-10-CM | POA: Diagnosis present

## 2014-11-23 DIAGNOSIS — K5190 Ulcerative colitis, unspecified, without complications: Secondary | ICD-10-CM | POA: Diagnosis present

## 2014-11-23 DIAGNOSIS — E282 Polycystic ovarian syndrome: Secondary | ICD-10-CM | POA: Diagnosis present

## 2014-11-23 DIAGNOSIS — I10 Essential (primary) hypertension: Secondary | ICD-10-CM | POA: Diagnosis present

## 2014-11-23 DIAGNOSIS — Z87891 Personal history of nicotine dependence: Secondary | ICD-10-CM | POA: Diagnosis not present

## 2014-11-23 DIAGNOSIS — R109 Unspecified abdominal pain: Secondary | ICD-10-CM | POA: Diagnosis present

## 2014-11-23 DIAGNOSIS — K51911 Ulcerative colitis, unspecified with rectal bleeding: Secondary | ICD-10-CM

## 2014-11-23 DIAGNOSIS — Z91040 Latex allergy status: Secondary | ICD-10-CM | POA: Diagnosis not present

## 2014-11-23 LAB — CBC
HEMATOCRIT: 37.5 % (ref 36.0–46.0)
Hemoglobin: 11.7 g/dL — ABNORMAL LOW (ref 12.0–15.0)
MCH: 26.5 pg (ref 26.0–34.0)
MCHC: 31.2 g/dL (ref 30.0–36.0)
MCV: 85 fL (ref 78.0–100.0)
Platelets: 312 10*3/uL (ref 150–400)
RBC: 4.41 MIL/uL (ref 3.87–5.11)
RDW: 13.6 % (ref 11.5–15.5)
WBC: 5.4 10*3/uL (ref 4.0–10.5)

## 2014-11-23 LAB — BASIC METABOLIC PANEL
Anion gap: 4 — ABNORMAL LOW (ref 5–15)
BUN: <5 mg/dL — ABNORMAL LOW (ref 6–23)
CO2: 24 mmol/L (ref 19–32)
Calcium: 8.1 mg/dL — ABNORMAL LOW (ref 8.4–10.5)
Chloride: 105 mEq/L (ref 96–112)
Creatinine, Ser: 0.66 mg/dL (ref 0.50–1.10)
GFR calc Af Amer: >90 mL/min (ref >90)
GFR calc non Af Amer: >90 mL/min (ref >90)
Glucose, Bld: 129 mg/dL — ABNORMAL HIGH (ref 70–99)
Potassium: 4 mmol/L (ref 3.5–5.1)
Sodium: 133 mmol/L — ABNORMAL LOW (ref 135–145)

## 2014-11-23 LAB — PREGNANCY, URINE: Preg Test, Ur: NEGATIVE

## 2014-11-23 LAB — CLOSTRIDIUM DIFFICILE BY PCR: Toxigenic C. Difficile by PCR: NEGATIVE

## 2014-11-23 MED ORDER — HYDROMORPHONE HCL 1 MG/ML IJ SOLN
1.0000 mg | Freq: Once | INTRAMUSCULAR | Status: AC
  Administered 2014-11-23: 1 mg via INTRAVENOUS
  Filled 2014-11-23: qty 1

## 2014-11-23 MED ORDER — METHYLPREDNISOLONE SODIUM SUCC 125 MG IJ SOLR
60.0000 mg | Freq: Three times a day (TID) | INTRAMUSCULAR | Status: DC
  Administered 2014-11-23 – 2014-11-25 (×8): 60 mg via INTRAVENOUS
  Filled 2014-11-23: qty 2
  Filled 2014-11-23 (×9): qty 0.96
  Filled 2014-11-23: qty 2

## 2014-11-23 MED ORDER — SODIUM CHLORIDE 0.9 % IV SOLN
INTRAVENOUS | Status: AC
  Administered 2014-11-23 (×2): via INTRAVENOUS

## 2014-11-23 MED ORDER — HYDROMORPHONE HCL 1 MG/ML IJ SOLN
1.0000 mg | INTRAMUSCULAR | Status: AC | PRN
  Filled 2014-11-23: qty 1

## 2014-11-23 MED ORDER — ONDANSETRON 4 MG PO TBDP
4.0000 mg | ORAL_TABLET | ORAL | Status: DC | PRN
  Administered 2014-11-25: 4 mg via ORAL
  Filled 2014-11-23 (×3): qty 1

## 2014-11-23 MED ORDER — DIAZEPAM 5 MG PO TABS
5.0000 mg | ORAL_TABLET | Freq: Two times a day (BID) | ORAL | Status: DC | PRN
  Administered 2014-11-29: 5 mg via ORAL
  Filled 2014-11-23: qty 1

## 2014-11-23 MED ORDER — IOHEXOL 300 MG/ML  SOLN
100.0000 mL | Freq: Once | INTRAMUSCULAR | Status: AC | PRN
  Administered 2014-11-23: 100 mL via INTRAVENOUS

## 2014-11-23 MED ORDER — SODIUM CHLORIDE 0.9 % IV SOLN
Freq: Once | INTRAVENOUS | Status: AC
  Administered 2014-11-23: 125 mL/h via INTRAVENOUS

## 2014-11-23 MED ORDER — ACETAMINOPHEN 325 MG PO TABS
650.0000 mg | ORAL_TABLET | Freq: Once | ORAL | Status: AC
  Administered 2014-11-23: 650 mg via ORAL
  Filled 2014-11-23: qty 2

## 2014-11-23 MED ORDER — LORATADINE 10 MG PO TABS
10.0000 mg | ORAL_TABLET | Freq: Every day | ORAL | Status: DC | PRN
  Administered 2014-11-26: 10 mg via ORAL
  Filled 2014-11-23 (×2): qty 1

## 2014-11-23 MED ORDER — CETYLPYRIDINIUM CHLORIDE 0.05 % MT LIQD
7.0000 mL | Freq: Two times a day (BID) | OROMUCOSAL | Status: DC
  Administered 2014-11-23 (×2): 7 mL via OROMUCOSAL

## 2014-11-23 MED ORDER — HYDROMORPHONE HCL 1 MG/ML IJ SOLN
1.0000 mg | Freq: Once | INTRAMUSCULAR | Status: DC
  Filled 2014-11-23: qty 1

## 2014-11-23 MED ORDER — FERROUS SULFATE 325 (65 FE) MG PO TABS
325.0000 mg | ORAL_TABLET | Freq: Every day | ORAL | Status: DC
  Administered 2014-11-24 – 2014-12-03 (×10): 325 mg via ORAL
  Filled 2014-11-23 (×15): qty 1

## 2014-11-23 MED ORDER — NORGESTIM-ETH ESTRAD TRIPHASIC 0.18/0.215/0.25 MG-35 MCG PO TABS
1.0000 | ORAL_TABLET | Freq: Every day | ORAL | Status: DC
  Administered 2014-11-23 – 2014-12-02 (×11): 1 via ORAL

## 2014-11-23 MED ORDER — ESCITALOPRAM OXALATE 20 MG PO TABS
20.0000 mg | ORAL_TABLET | Freq: Every day | ORAL | Status: DC
  Administered 2014-11-23 – 2014-12-02 (×11): 20 mg via ORAL
  Filled 2014-11-23 (×5): qty 1
  Filled 2014-11-23: qty 2
  Filled 2014-11-23 (×6): qty 1

## 2014-11-23 MED ORDER — ONDANSETRON HCL 4 MG/2ML IJ SOLN: 4.0000 mg | Freq: Three times a day (TID) | INTRAMUSCULAR | Status: AC | PRN

## 2014-11-23 MED ORDER — BUDESONIDE 3 MG PO CP24
9.0000 mg | ORAL_CAPSULE | Freq: Every day | ORAL | Status: DC
  Administered 2014-11-23 – 2014-12-03 (×11): 9 mg via ORAL
  Filled 2014-11-23 (×11): qty 3

## 2014-11-23 MED ORDER — PANTOPRAZOLE SODIUM 40 MG PO TBEC
40.0000 mg | DELAYED_RELEASE_TABLET | Freq: Every day | ORAL | Status: DC
  Administered 2014-11-23 – 2014-12-02 (×11): 40 mg via ORAL
  Filled 2014-11-23 (×12): qty 1

## 2014-11-23 MED ORDER — HYDROMORPHONE HCL 1 MG/ML IJ SOLN
1.0000 mg | INTRAMUSCULAR | Status: DC | PRN
  Administered 2014-11-23 – 2014-12-03 (×48): 1 mg via INTRAVENOUS
  Filled 2014-11-23 (×46): qty 1

## 2014-11-23 NOTE — Consult Note (Signed)
[facility] Consult Note  Referring Provider: No ref. provider found Primary Care Physician:  Cammy Copa, MD Primary Gastroenterologist:  Dr.  Laurel Dimmer Complaint: Abdominal pain and bloody diarrhea HPI: Kathleen Mcdonald is an 33 y.o. white female  admitted with a flare of ulcerative colitis. She is seen by Dr. Watt Climes and has had steroid refractory disease and was originally on symponi which has not been helpful and she was recently switched to Remicade which she has had 2 doses. She is also on budesonide 9 mg per day. 3 days ago she began having worsening bloody diarrhea and abdominal pain with some nausea and fever reported as high as 103 with 101 documented in the emergency room. She denies any recent travel or recent antibiotics Past Medical History  Diagnosis Date  . Irregular heart beat   . Ulcerative colitis   . PCOS (polycystic ovarian syndrome)   . Hypertension     Past Surgical History  Procedure Laterality Date  . Wisdom tooth extraction      Medications Prior to Admission  Medication Sig Dispense Refill  . budesonide (ENTOCORT EC) 3 MG 24 hr capsule Take 3 mg by mouth daily.    . clidinium-chlordiazePOXIDE (LIBRAX) 5-2.5 MG per capsule Take 1 capsule by mouth every 6 (six) hours as needed.    . diazepam (VALIUM) 5 MG tablet Take 1 tablet (5 mg total) by mouth every 12 (twelve) hours as needed for muscle spasms. 10 tablet 0  . escitalopram (LEXAPRO) 20 MG tablet Take 20 mg by mouth at bedtime.     Marland Kitchen HYDROcodone-acetaminophen (NORCO/VICODIN) 5-325 MG per tablet Take 1-2 tablets by mouth every 6 (six) hours as needed for moderate pain or severe pain. 11 tablet 0  . Norgestimate-Ethinyl Estradiol Triphasic (TRINESSA, 28,) 0.18/0.215/0.25 MG-35 MCG tablet Take 1 tablet by mouth at bedtime.    . pantoprazole (PROTONIX) 40 MG tablet Take 40 mg by mouth at bedtime.     . vitamin C (ASCORBIC ACID) 500 MG tablet Take 500 mg by mouth every morning.    . cetirizine  (ZYRTEC) 10 MG tablet Take 10 mg by mouth daily as needed for allergies.     . ferrous sulfate 325 (65 FE) MG tablet Take 325 mg by mouth daily with breakfast.    . inFLIXimab (REMICADE) 100 MG injection Inject 100 mg into the vein.    Marland Kitchen loratadine (CLARITIN) 10 MG tablet Take 10 mg by mouth daily as needed for allergies.     Marland Kitchen ondansetron (ZOFRAN ODT) 4 MG disintegrating tablet Take 1 tablet (4 mg total) by mouth every 4 (four) hours as needed for nausea or vomiting. 20 tablet 0    Allergies:  Allergies  Allergen Reactions  . Chocolate Anaphylaxis  . Latex Hives and Swelling  . Nickel Hives    Family History  Problem Relation Age of Onset  . Anesthesia problems Neg Hx     Social History:  reports that she quit smoking about 2 years ago. She has never used smokeless tobacco. She reports that she drinks alcohol. She reports that she does not use illicit drugs.  Review of Systems: negative except as above2   Blood pressure 118/73, pulse 76, temperature 98.3 F (36.8 C), temperature source Oral, resp. rate 18, height 5' 3"  (1.6 m), weight 76.658 kg (169 lb), last menstrual period 11/01/2014, SpO2 96 %. Head: Normocephalic, without obvious abnormality, atraumatic Neck: no adenopathy, no carotid bruit, no JVD, supple, symmetrical, trachea midline and thyroid not enlarged, symmetric,  no tenderness/mass/nodules Resp: clear to auscultation bilaterally Cardio: regular rate and rhythm, S1, S2 normal, no murmur, click, rub or gallop GI: Abdomen soft diffusely tender hepatosplenomegaly mass or rebound. There is some guarding mainly in the left abdomen Extremities: extremities normal, atraumatic, no cyanosis or edema  Results for orders placed or performed during the hospital encounter of 11/22/14 (from the past 48 hour(s))  Urinalysis, Routine w reflex microscopic     Status: Abnormal   Collection Time: 11/22/14  9:21 PM  Result Value Ref Range   Color, Urine YELLOW YELLOW   APPearance  CLEAR CLEAR   Specific Gravity, Urine 1.008 1.005 - 1.030   pH 6.5 5.0 - 8.0   Glucose, UA NEGATIVE NEGATIVE mg/dL   Hgb urine dipstick TRACE (A) NEGATIVE   Bilirubin Urine NEGATIVE NEGATIVE   Ketones, ur NEGATIVE NEGATIVE mg/dL   Protein, ur NEGATIVE NEGATIVE mg/dL   Urobilinogen, UA 0.2 0.0 - 1.0 mg/dL   Nitrite NEGATIVE NEGATIVE   Leukocytes, UA NEGATIVE NEGATIVE  Urine microscopic-add on     Status: None   Collection Time: 11/22/14  9:21 PM  Result Value Ref Range   Squamous Epithelial / LPF RARE RARE   RBC / HPF 0-2 <3 RBC/hpf  Pregnancy, urine     Status: None   Collection Time: 11/22/14  9:21 PM  Result Value Ref Range   Preg Test, Ur NEGATIVE NEGATIVE    Comment:        THE SENSITIVITY OF THIS METHODOLOGY IS >20 mIU/mL.   CBC with Differential     Status: Abnormal   Collection Time: 11/22/14  9:25 PM  Result Value Ref Range   WBC 6.7 4.0 - 10.5 K/uL   RBC 4.37 3.87 - 5.11 MIL/uL   Hemoglobin 11.5 (L) 12.0 - 15.0 g/dL   HCT 37.2 36.0 - 46.0 %   MCV 85.1 78.0 - 100.0 fL   MCH 26.3 26.0 - 34.0 pg   MCHC 30.9 30.0 - 36.0 g/dL   RDW 13.3 11.5 - 15.5 %   Platelets 385 150 - 400 K/uL   Neutrophils Relative % 66 43 - 77 %   Neutro Abs 4.4 1.7 - 7.7 K/uL   Lymphocytes Relative 14 12 - 46 %   Lymphs Abs 0.9 0.7 - 4.0 K/uL   Monocytes Relative 14 (H) 3 - 12 %   Monocytes Absolute 0.9 0.1 - 1.0 K/uL   Eosinophils Relative 5 0 - 5 %   Eosinophils Absolute 0.3 0.0 - 0.7 K/uL   Basophils Relative 1 0 - 1 %   Basophils Absolute 0.0 0.0 - 0.1 K/uL  Comprehensive metabolic panel     Status: Abnormal   Collection Time: 11/22/14  9:25 PM  Result Value Ref Range   Sodium 134 (L) 135 - 145 mmol/L    Comment: Please note change in reference range.   Potassium 3.7 3.5 - 5.1 mmol/L    Comment: Please note change in reference range.   Chloride 102 96 - 112 mEq/L   CO2 25 19 - 32 mmol/L   Glucose, Bld 134 (H) 70 - 99 mg/dL   BUN <5 (L) 6 - 23 mg/dL   Creatinine, Ser 0.70 0.50 -  1.10 mg/dL   Calcium 8.8 8.4 - 10.5 mg/dL   Total Protein 7.4 6.0 - 8.3 g/dL   Albumin 3.1 (L) 3.5 - 5.2 g/dL   AST 15 0 - 37 U/L   ALT 7 0 - 35 U/L   Alkaline Phosphatase  53 39 - 117 U/L   Total Bilirubin 0.3 0.3 - 1.2 mg/dL   GFR calc non Af Amer >90 >90 mL/min   GFR calc Af Amer >90 >90 mL/min    Comment: (NOTE) The eGFR has been calculated using the CKD EPI equation. This calculation has not been validated in all clinical situations. eGFR's persistently <90 mL/min signify possible Chronic Kidney Disease.    Anion gap 7 5 - 15  Lipase, blood     Status: None   Collection Time: 11/22/14  9:25 PM  Result Value Ref Range   Lipase 23 11 - 59 U/L  CBC     Status: Abnormal   Collection Time: 11/23/14  5:58 AM  Result Value Ref Range   WBC 5.4 4.0 - 10.5 K/uL   RBC 4.41 3.87 - 5.11 MIL/uL   Hemoglobin 11.7 (L) 12.0 - 15.0 g/dL   HCT 37.5 36.0 - 46.0 %   MCV 85.0 78.0 - 100.0 fL   MCH 26.5 26.0 - 34.0 pg   MCHC 31.2 30.0 - 36.0 g/dL   RDW 13.6 11.5 - 15.5 %   Platelets 312 150 - 400 K/uL  Basic metabolic panel     Status: Abnormal   Collection Time: 11/23/14  6:58 AM  Result Value Ref Range   Sodium 133 (L) 135 - 145 mmol/L    Comment: Please note change in reference range.   Potassium 4.0 3.5 - 5.1 mmol/L    Comment: Please note change in reference range.   Chloride 105 96 - 112 mEq/L   CO2 24 19 - 32 mmol/L   Glucose, Bld 129 (H) 70 - 99 mg/dL   BUN <5 (L) 6 - 23 mg/dL   Creatinine, Ser 0.66 0.50 - 1.10 mg/dL   Calcium 8.1 (L) 8.4 - 10.5 mg/dL   GFR calc non Af Amer >90 >90 mL/min   GFR calc Af Amer >90 >90 mL/min    Comment: (NOTE) The eGFR has been calculated using the CKD EPI equation. This calculation has not been validated in all clinical situations. eGFR's persistently <90 mL/min signify possible Chronic Kidney Disease.    Anion gap 4 (L) 5 - 15   Ct Abdomen Pelvis W Contrast  11/23/2014   CLINICAL DATA:  Fever. Diarrhea with bright red blood in stool.  Abdominal pain. History of ulcerative colitis.  EXAM: CT ABDOMEN AND PELVIS WITH CONTRAST  TECHNIQUE: Multidetector CT imaging of the abdomen and pelvis was performed using the standard protocol following bolus administration of intravenous contrast.  CONTRAST:  157m OMNIPAQUE IOHEXOL 300 MG/ML  SOLN  COMPARISON:  10/26/2014  FINDINGS: Dependent atelectasis in the lung bases. No effusions. Heart is normal size.  Low-density area noted adjacent to the falciform ligament in the liver compatible with focal fatty infiltration. Otherwise liver is unremarkable. Gallbladder, spleen, pancreas, adrenals and kidneys are unremarkable. Small nonobstructing stone in the mid to lower pole of the right kidney, stable. No ureteral stones or hydronephrosis.  There is diffuse colonic wall thickening, from the hepatic flexure to the rectosigmoid colon, most pronounced in the sigmoid colon and descending colon. These findings have progressed since prior study. Mild surrounding inflammatory stranding around the affected colon. Findings likely reflect changes related to patient's known ulcerative colitis. Stomach and small bowel are decompressed. No evidence of obstruction. Retrocecal appendix again noted and unchanged, upper limits normal in diameter.  Mildly prominent pericolonic lymph nodes are stable. Trace free fluid in the pelvis.  Uterus, adnexae and urinary  bladder are unremarkable. Aorta is normal caliber.  IMPRESSION: Worsening wall thickening and inflammatory change in the colon from the hepatic flexure to the rectosigmoid colon, most pronounced in the descending colon and sigmoid colon compatible with worsening ulcerative colitis. Stable prominent pericolonic mesenteric lymph nodes.   Electronically Signed   By: Rolm Baptise M.D.   On: 11/23/2014 00:58    Assessment: Ulcerative colitis with acute flare despite treatment with Remicade Plan:  1. Will obtain stool culture and C. difficile rule out concomitant  infection 2. IV Solu-Medrol 3. Consider doubling dose of Remicade which she is due for in 2 weeks Sharonne Ricketts C 11/23/2014, 10:05 AM

## 2014-11-23 NOTE — ED Notes (Addendum)
Awake. Verbally responsive. A/O x4. Resp even and unlabored. No audible adventitious breath sounds noted. ABC's intact. No diarrhea noted.

## 2014-11-23 NOTE — ED Notes (Signed)
Resting quietly with eye closed. Easily arousable. Verbally responsive. Resp even and unlabored. ABC's intact. NAD noted.  

## 2014-11-23 NOTE — ED Notes (Signed)
Patient transported to CT and returned without distress noted.

## 2014-11-23 NOTE — ED Notes (Addendum)
Pt awaken. Verbally responsive. A/O x4. Resp even and unlabored. No audible adventitious breath sounds noted. ABC's intact. Pt ambulated to BR with steady gait.

## 2014-11-23 NOTE — ED Notes (Addendum)
Awake. Verbally responsive. A/O x4. Resp even and unlabored. No audible adventitious breath sounds noted. ABC's intact. Pt ambulated to BR to void. Explained to family that pt cannot have a warm blanket d/t fever of 101.1. Pt given Tylenol for fever and Dilaudid for pain. Will monitor results.

## 2014-11-23 NOTE — H&P (Signed)
[facility] History and Physical  Kathleen Mcdonald ZOX:[id number] DOB: 03/29/1981 DOA: 11/22/2014  Referring physician: EDP PCP: Irving CopasHACKER,ROBERT KELLER, MD   Chief Complaint: Abdominal pain   HPI: Kathleen Mcdonald is a 33 y.o. female h/o UC, currently attempting to control with Remicaid injected earlier this month.  Patient presents to ED with worsening abdominal pain.  Pain started 1 week ago, worsening nausea, diarrhea, blood in stool, feels like prior UC flare.  Running temperature at home up to 103 (101.1 in ED).  In addition to remicaid she is being treated with budesonide 3mg  daily.  GI doc is Dr. Vida RiggerMarc Magod.  Nothing makes symptoms better or worse.  Review of Systems: Systems reviewed.  As above, otherwise negative  Past Medical History  Diagnosis Date  . Irregular heart beat   . Ulcerative colitis   . PCOS (polycystic ovarian syndrome)   . Hypertension    Past Surgical History  Procedure Laterality Date  . Wisdom tooth extraction     Social History:  reports that she quit smoking about 2 years ago. She has never used smokeless tobacco. She reports that she drinks alcohol. She reports that she does not use illicit drugs.  Allergies  Allergen Reactions  . Chocolate Anaphylaxis  . Latex Hives and Swelling  . Nickel Hives    Family History  Problem Relation Age of Onset  . Anesthesia problems Neg Hx      Prior to Admission medications   Medication Sig Start Date End Date Taking? Authorizing Provider  budesonide (ENTOCORT EC) 3 MG 24 hr capsule Take 3 mg by mouth daily.   Yes Historical Provider, MD  clidinium-chlordiazePOXIDE (LIBRAX) 5-2.5 MG per capsule Take 1 capsule by mouth every 6 (six) hours as needed.   Yes Historical Provider, MD  diazepam (VALIUM) 5 MG tablet Take 1 tablet (5 mg total) by mouth every 12 (twelve) hours as needed for muscle spasms. 10/26/14  Yes Antony MaduraKelly Humes, PA-C  escitalopram (LEXAPRO) 20 MG tablet Take 20 mg by mouth at bedtime.    Yes  Historical Provider, MD  HYDROcodone-acetaminophen (NORCO/VICODIN) 5-325 MG per tablet Take 1-2 tablets by mouth every 6 (six) hours as needed for moderate pain or severe pain. 10/26/14  Yes Antony MaduraKelly Humes, PA-C  Norgestimate-Ethinyl Estradiol Triphasic (TRINESSA, 28,) 0.18/0.215/0.25 MG-35 MCG tablet Take 1 tablet by mouth at bedtime.   Yes Historical Provider, MD  pantoprazole (PROTONIX) 40 MG tablet Take 40 mg by mouth at bedtime.    Yes Historical Provider, MD  vitamin C (ASCORBIC ACID) 500 MG tablet Take 500 mg by mouth every morning.   Yes Historical Provider, MD  cetirizine (ZYRTEC) 10 MG tablet Take 10 mg by mouth daily as needed for allergies.     Historical Provider, MD  ferrous sulfate 325 (65 FE) MG tablet Take 325 mg by mouth daily with breakfast.    Historical Provider, MD  inFLIXimab (REMICADE) 100 MG injection Inject 100 mg into the vein.    Historical Provider, MD  loratadine (CLARITIN) 10 MG tablet Take 10 mg by mouth daily as needed for allergies.     Historical Provider, MD  ondansetron (ZOFRAN ODT) 4 MG disintegrating tablet Take 1 tablet (4 mg total) by mouth every 4 (four) hours as needed for nausea or vomiting. 08/29/14   Arby BarretteMarcy Pfeiffer, MD   Physical Exam: Filed Vitals:   11/23/14 0038  BP: 112/65  Pulse: 88  Temp:   Resp: 18    BP 112/65 mmHg  Pulse 88  Temp(Src) 101.1 F (38.4 C) (Rectal)  Resp 18  Ht 5\' 3"  (1.6 m)  Wt 76.658 kg (169 lb)  BMI 29.94 kg/m2  SpO2 93%  LMP 11/01/2014 (Approximate)  General Appearance:    Alert, oriented, no distress, appears stated age  Head:    Normocephalic, atraumatic  Eyes:    PERRL, EOMI, sclera non-icteric        Nose:   Nares without drainage or epistaxis. Mucosa, turbinates normal  Throat:   Moist mucous membranes. Oropharynx without erythema or exudate.  Neck:   Supple. No carotid bruits.  No thyromegaly.  No lymphadenopathy.   Back:     No CVA tenderness, no spinal tenderness  Lungs:     Clear to auscultation  bilaterally, without wheezes, rhonchi or rales  Chest wall:    No tenderness to palpitation  Heart:    Regular rate and rhythm without murmurs, gallops, rubs  Abdomen:     Diffuse tenderness with some guarding, nondistended, normal bowel sounds, no organomegaly  Genitalia:    deferred  Rectal:    deferred  Extremities:   No clubbing, cyanosis or edema.  Pulses:   2+ and symmetric all extremities  Skin:   Skin color, texture, turgor normal, no rashes or lesions  Lymph nodes:   Cervical, supraclavicular, and axillary nodes normal  Neurologic:   CNII-XII intact. Normal strength, sensation and reflexes      throughout    Labs on Admission:  Basic Metabolic Panel:  Recent Labs Lab 11/22/14 2125  NA 134*  K 3.7  CL 102  CO2 25  GLUCOSE 134*  BUN <5*  CREATININE 0.70  CALCIUM 8.8   Liver Function Tests:  Recent Labs Lab 11/22/14 2125  AST 15  ALT 7  ALKPHOS 53  BILITOT 0.3  PROT 7.4  ALBUMIN 3.1*    Recent Labs Lab 11/22/14 2125  LIPASE 23   No results for input(s): AMMONIA in the last 168 hours. CBC:  Recent Labs Lab 11/22/14 2125  WBC 6.7  NEUTROABS 4.4  HGB 11.5*  HCT 37.2  MCV 85.1  PLT 385   Cardiac Enzymes: No results for input(s): CKTOTAL, CKMB, CKMBINDEX, TROPONINI in the last 168 hours.  BNP (last 3 results) No results for input(s): PROBNP in the last 8760 hours. CBG: No results for input(s): GLUCAP in the last 168 hours.  Radiological Exams on Admission: Ct Abdomen Pelvis W Contrast  11/23/2014   CLINICAL DATA:  Fever. Diarrhea with bright red blood in stool. Abdominal pain. History of ulcerative colitis.  EXAM: CT ABDOMEN AND PELVIS WITH CONTRAST  TECHNIQUE: Multidetector CT imaging of the abdomen and pelvis was performed using the standard protocol following bolus administration of intravenous contrast.  CONTRAST:  OMNIPAQUE IOHEXOL 300 MG/ML  SOLN  COMPARISON:  10/26/2014  FINDINGS: Dependent atelectasis in the lung bases. No  effusions. Heart is normal size.  Low-density area noted adjacent to the falciform ligament in the liver compatible with focal fatty infiltration. Otherwise liver is unremarkable. Gallbladder, spleen, pancreas, adrenals and kidneys are unremarkable. Small nonobstructing stone in the mid to lower pole of the right kidney, stable. No ureteral stones or hydronephrosis.  There is diffuse colonic wall thickening, from the hepatic flexure to the rectosigmoid colon, most pronounced in the sigmoid colon and descending colon. These findings have progressed since prior study. Mild surrounding inflammatory stranding around the affected colon. Findings likely reflect changes related to patient's known ulcerative colitis. Stomach and small bowel are decompressed. No evidence  of obstruction. Retrocecal appendix again noted and unchanged, upper limits normal in diameter.  Mildly prominent pericolonic lymph nodes are stable. Trace free fluid in the pelvis.  Uterus, adnexae and urinary bladder are unremarkable. Aorta is normal caliber.  IMPRESSION: Worsening wall thickening and inflammatory change in the colon from the hepatic flexure to the rectosigmoid colon, most pronounced in the descending colon and sigmoid colon compatible with worsening ulcerative colitis. Stable prominent pericolonic mesenteric lymph nodes.   Electronically Signed   By: Charlett NoseKevin  Dover M.D.   On: 11/23/2014 00:58    EKG: Independently reviewed.  Assessment/Plan Active Problems:   Ulcerative colitis   1. Ulcerative colitis - 1. Solumedrol 60mg  q8h ordered 2. Consult GI in AM 3. Does have mild anemia but at 11.5 no indication for transfusion. 4. Fever of 101.1, using tylenol for this, non-toxic presently and no other systemic signs of infection (no WBC, no toxic megacolon, etc).  Will hold off on ABx treatment for now as this may just be related to severe inflamation of colon seen on CT.  If fever persists or develops signs of systemic toxicity then  will start ABx 5. Pain control with IV dilaudid.    Code Status: Full Code  Family Communication: Family at bedside Disposition Plan: Admit to inpatient   Time spent: 70 min  GARDNER, JARED M. [facility] Pager [phone]  If 7AM-7PM, please contact the day team taking care of the patient [email or web address] Password [email or web address] 11/23/2014, 2:04 AM

## 2014-11-23 NOTE — Progress Notes (Addendum)
Patient seen and examined , admitted 12/28  Subjective, fever last night, continues to have abdominal pain, bloody diarrhea    History of present illness Kathleen Mcdonald is an 33 y.o. white female admitted with a flare of ulcerative colitis. She is seen by Dr. Ewing SchleinMagod and has had steroid refractory disease and was originally on symponi which has not been helpful and she was recently switched to Remicade which she has had 2 doses. She is also on budesonide 9 mg per day. 3 days ago she began having worsening bloody diarrhea and abdominal pain with some nausea and fever reported as high as 103 with 101 documented in the emergency room. She denies any recent travel or recent antibiotics   Assessment and plan Ulcerated colitis flare Check C. Difficile Continue IV Solu-Medrol Outpatient Remicade in 2 weeks Continue aggressive IV hydration   Anxiety Continue Valium, Lexapro

## 2014-11-24 ENCOUNTER — Encounter (HOSPITAL_COMMUNITY): Payer: Self-pay | Admitting: *Deleted

## 2014-11-24 MED ORDER — SODIUM CHLORIDE 0.9 % IV SOLN
INTRAVENOUS | Status: AC
  Administered 2014-11-24: 20:00:00 via INTRAVENOUS

## 2014-11-24 NOTE — Progress Notes (Addendum)
[facility] PROGRESS NOTE  Kathleen AdaJennifer S Newsome WUJ:[id number] DOB: 07/08/1981 DOA: 11/22/2014 PCP: Irving CopasHACKER,ROBERT KELLER, MD  Assessment/Plan: Active Problems:   Ulcerative colitis     Assessment and plan Ulcerative colitis flare C. difficile negative, stool culture pending Continue IV Solu-Medrol, budesonide, 6 MP? Outpatient Remicade 4 weeks ago Advance diet today Continue aggressive IV hydration   Anxiety Continue Valium, Lexapro   Code Status: full Family Communication: family updated about patient's clinical progress Disposition Plan:  As above    Brief narrative: Kathleen Mcdonald is an [age] y.o. white female admitted with a flare of ulcerative colitis. She is seen by Dr. Ewing SchleinMagod and has had steroid refractory disease and was originally on symponi which has not been helpful and she was recently switched to Remicade which she has had 2 doses. She is also on budesonide 9 mg per day. 3 days ago she began having worsening bloody diarrhea and abdominal pain with some nausea and fever reported as high as 103 with 101 documented in the emergency room. She denies any recent travel or recent antibiotics  Consultants:  Gastroenterology  Procedures:  None  Antibiotics: None  HPI/Subjective: Complaining of abdominal cramping, improving hematochezia   Objective: Filed Vitals:   11/23/14 0949 11/23/14 1400 11/23/14 2142 11/24/14 0544  BP: 118/73 122/74 110/73 96/62  Pulse: 76 67 75 50  Temp: 98.3 F (36.8 C) 98.1 F (36.7 C) 98.2 F (36.8 C) 97.6 F (36.4 C)  TempSrc: Oral Oral Oral Oral  Resp: 18 18 18 16   Height:      Weight:      SpO2: 96% 98% 97% 96%    Intake/Output Summary (Last 24 hours) at 11/24/14 1337 Last data filed at 11/24/14 1000  Gross per 24 hour  Intake   1355 ml  Output   2000 ml  Net   -645 ml    Exam:  General: alert & oriented x 3 In NAD  Cardiovascular: RRR, nl S1 s2  Respiratory: Decreased breath sounds at the bases, scattered  rhonchi, no crackles  Abdomen: soft +BS NT/ND, no masses palpable  Extremities: No cyanosis and no edema      Data Reviewed: Basic Metabolic Panel:  Recent Labs Lab 11/22/14 2125 11/23/14 0658  NA 134* 133*  K 3.7 4.0  CL 102 105  CO2 25 24  GLUCOSE 134* 129*  BUN <5* <5*  CREATININE 0.70 0.66  CALCIUM 8.8 8.1*    Liver Function Tests:  Recent Labs Lab 11/22/14 2125  AST 15  ALT 7  ALKPHOS 53  BILITOT 0.3  PROT 7.4  ALBUMIN 3.1*    Recent Labs Lab 11/22/14 2125  LIPASE 23   No results for input(s): AMMONIA in the last 168 hours.  CBC:  Recent Labs Lab 11/22/14 2125 11/23/14 0558  WBC 6.7 5.4  NEUTROABS 4.4  --   HGB 11.5* 11.7*  HCT 37.2 37.5  MCV 85.1 85.0  PLT 385 312    Cardiac Enzymes: No results for input(s): CKTOTAL, CKMB, CKMBINDEX, TROPONINI in the last 168 hours. BNP (last 3 results) No results for input(s): PROBNP in the last 8760 hours.   CBG: No results for input(s): GLUCAP in the last 168 hours.  Recent Results (from the past 240 hour(s))  Clostridium Difficile by PCR     Status: None   Collection Time: 11/23/14  2:51 PM  Result Value Ref Range Status   C difficile by pcr NEGATIVE NEGATIVE Final    Comment: [facility]     Studies: Ct Abdomen Pelvis W Contrast  11/23/2014   CLINICAL DATA:  Fever. Diarrhea with bright red blood in stool. Abdominal pain. History of ulcerative colitis.  EXAM: CT ABDOMEN AND PELVIS WITH CONTRAST  TECHNIQUE: Multidetector CT imaging of the abdomen and pelvis was performed using the standard protocol following bolus administration of intravenous contrast.  CONTRAST:  100mL OMNIPAQUE IOHEXOL 300 MG/ML  SOLN  COMPARISON:  10/26/2014  FINDINGS: Dependent atelectasis in the lung bases. No effusions. Heart is normal size.  Low-density area noted adjacent to the falciform ligament in the liver compatible with focal fatty infiltration. Otherwise liver is unremarkable. Gallbladder,  spleen, pancreas, adrenals and kidneys are unremarkable. Small nonobstructing stone in the mid to lower pole of the right kidney, stable. No ureteral stones or hydronephrosis.  There is diffuse colonic wall thickening, from the hepatic flexure to the rectosigmoid colon, most pronounced in the sigmoid colon and descending colon. These findings have progressed since prior study. Mild surrounding inflammatory stranding around the affected colon. Findings likely reflect changes related to patient's known ulcerative colitis. Stomach and small bowel are decompressed. No evidence of obstruction. Retrocecal appendix again noted and unchanged, upper limits normal in diameter.  Mildly prominent pericolonic lymph nodes are stable. Trace free fluid in the pelvis.  Uterus, adnexae and urinary bladder are unremarkable. Aorta is normal caliber.  IMPRESSION: Worsening wall thickening and inflammatory change in the colon from the hepatic flexure to the rectosigmoid colon, most pronounced in the descending colon and sigmoid colon compatible with worsening ulcerative colitis. Stable prominent pericolonic mesenteric lymph nodes.   Electronically Signed   By: Charlett NoseKevin  Dover M.D.   On: 11/23/2014 00:58   Ct Abdomen Pelvis W Contrast  10/26/2014   CLINICAL DATA:  Acute onset of generalized abdominal and lower back pain. Elevated lipase. Initial encounter.  EXAM: CT ABDOMEN AND PELVIS WITH CONTRAST  TECHNIQUE: Multidetector CT imaging of the abdomen and pelvis was performed using the standard protocol following bolus administration of intravenous contrast.  CONTRAST:  100mL OMNIPAQUE IOHEXOL 300 MG/ML  SOLN  COMPARISON:  CT of the abdomen and pelvis from 08/29/2014  FINDINGS: Minimal bibasilar atelectasis is noted.  The liver and spleen are unremarkable in appearance. The gallbladder is within normal limits. The pancreas and adrenal glands are unremarkable.  A few tiny bilateral renal stones are seen, measuring up to 2 mm in size. The  kidneys are otherwise unremarkable. There is no evidence of hydronephrosis. No obstructing ureteral stones are seen. No perinephric stranding is appreciated.  No free fluid is identified. The small bowel is unremarkable in appearance. The stomach is within normal limits. No acute vascular abnormalities are seen.  The appendix is borderline normal in caliber, without definite evidence of appendicitis. Scattered pericecal nodes are borderline normal in size, and relatively stable from the prior study. There is suggestion of mild wall thickening along the entirety of the colon, which is perhaps slightly more prominent than on the prior study and may reflect the patient's history of ulcerative colitis. There is slightly increased prominence of the surrounding vasculature.  Associated mildly prominent nodes are noted about the descending and proximal sigmoid colon, more prominent than on the prior study.  The bladder is mildly distended and grossly unremarkable. The uterus is within normal limits. The ovaries are relatively symmetric. No suspicious adnexal masses are seen. No inguinal lymphadenopathy is seen.  No acute osseous abnormalities are identified.  IMPRESSION: 1. Suggestion of mild wall thickening along the  entirety of the colon, which is perhaps slightly more prominent than on the prior study. Mildly prominent surrounding nodes also seen about the descending and proximal sigmoid colon. This may reflect mild acute exacerbation of the patient's known ulcerative colitis. 2. Few tiny bilateral nonobstructing renal stones seen, measuring up to 2 mm in size. No evidence of hydronephrosis.   Electronically Signed   By: Roanna Raider M.D.   On: 10/26/2014 03:50    Scheduled Meds: . budesonide  9 mg Oral Daily  . escitalopram  20 mg Oral QHS  . ferrous sulfate  325 mg Oral Q breakfast  .  HYDROmorphone (DILAUDID) injection  1 mg Intravenous Once  . methylPREDNISolone (SOLU-MEDROL) injection  60 mg Intravenous 3  times per day  . Norgestimate-Ethinyl Estradiol Triphasic  1 tablet Oral QHS  . pantoprazole  40 mg Oral QHS   Continuous Infusions:   Active Problems:   Ulcerative colitis    Time spent: 40 minutes   [facility]  [facility] Pager [phone]. If 7PM-7AM, please contact night-coverage at [email or web address], password [facility] 11/24/2014, 1:37 PM  LOS: 2 days

## 2014-11-24 NOTE — Progress Notes (Signed)
[facility] Progress Note  Subjective: Persistent abdominal cramps, some decrease in diarrhea and bloody stool. No more fever. Headache resolved.  Objective: Vital signs in last 24 hours: Temp:  [97.6 F (36.4 C)-98.3 F (36.8 C)] 97.6 F (36.4 C) (12/29 0544) Pulse Rate:  [50-85] 50 (12/29 0544) Resp:  [16-18] 16 (12/29 0544) BP: (96-122)/(62-74) 96/62 mmHg (12/29 0544) SpO2:  [96 %-100 %] 96 % (12/29 0544) Weight change:    PE: Abdomen soft still with diffuse mild tenderness.  Lab Results: Results for orders placed or performed during the hospital encounter of 11/22/14 (from the past 24 hour(s))  Clostridium Difficile by PCR     Status: None   Collection Time: 11/23/14  2:51 PM  Result Value Ref Range   C difficile by pcr NEGATIVE NEGATIVE    Studies/Results: Ct Abdomen Pelvis W Contrast  11/23/2014   CLINICAL DATA:  Fever. Diarrhea with bright red blood in stool. Abdominal pain. History of ulcerative colitis.  EXAM: CT ABDOMEN AND PELVIS WITH CONTRAST  TECHNIQUE: Multidetector CT imaging of the abdomen and pelvis was performed using the standard protocol following bolus administration of intravenous contrast.  CONTRAST:  100mL OMNIPAQUE IOHEXOL 300 MG/ML  SOLN  COMPARISON:  10/26/2014  FINDINGS: Dependent atelectasis in the lung bases. No effusions. Heart is normal size.  Low-density area noted adjacent to the falciform ligament in the liver compatible with focal fatty infiltration. Otherwise liver is unremarkable. Gallbladder, spleen, pancreas, adrenals and kidneys are unremarkable. Small nonobstructing stone in the mid to lower pole of the right kidney, stable. No ureteral stones or hydronephrosis.  There is diffuse colonic wall thickening, from the hepatic flexure to the rectosigmoid colon, most pronounced in the sigmoid colon and descending colon. These findings have progressed since prior study. Mild surrounding inflammatory stranding around the affected colon.  Findings likely reflect changes related to patient's known ulcerative colitis. Stomach and small bowel are decompressed. No evidence of obstruction. Retrocecal appendix again noted and unchanged, upper limits normal in diameter.  Mildly prominent pericolonic lymph nodes are stable. Trace free fluid in the pelvis.  Uterus, adnexae and urinary bladder are unremarkable. Aorta is normal caliber.  IMPRESSION: Worsening wall thickening and inflammatory change in the colon from the hepatic flexure to the rectosigmoid colon, most pronounced in the descending colon and sigmoid colon compatible with worsening ulcerative colitis. Stable prominent pericolonic mesenteric lymph nodes.   Electronically Signed   By: Charlett NoseKevin  Dover M.D.   On: 11/23/2014 00:58      Assessment: Steroid refractory ulcerative colitis, currently 4 weeks out from last Remicade treatment, on Solu-Medrol and also on budesonide. C. difficile toxin negative, stool culture pending  Plan: Continue IV Solu-Medrol, budesonide for now, await final stool cultures. Consider adding 6-MP. Will try to gradually advance diet today.     Juliya Magill C 11/24/2014, 8:44 AM

## 2014-11-25 LAB — BASIC METABOLIC PANEL
Anion gap: 3 — ABNORMAL LOW (ref 5–15)
BUN: 7 mg/dL (ref 6–23)
CO2: 28 mmol/L (ref 19–32)
CREATININE: 0.58 mg/dL (ref 0.50–1.10)
Calcium: 8.3 mg/dL — ABNORMAL LOW (ref 8.4–10.5)
Chloride: 106 mEq/L (ref 96–112)
GFR calc Af Amer: >90 mL/min (ref >90)
GFR calc non Af Amer: >90 mL/min (ref >90)
GLUCOSE: 137 mg/dL — AB (ref 70–99)
POTASSIUM: 3.9 mmol/L (ref 3.5–5.1)
Sodium: 137 mmol/L (ref 135–145)

## 2014-11-25 LAB — CBC
HEMATOCRIT: 32.3 % — AB (ref 36.0–46.0)
HEMOGLOBIN: 10.2 g/dL — AB (ref 12.0–15.0)
MCH: 26.9 pg (ref 26.0–34.0)
MCHC: 31.6 g/dL (ref 30.0–36.0)
MCV: 85.2 fL (ref 78.0–100.0)
Platelets: 311 10*3/uL (ref 150–400)
RBC: 3.79 MIL/uL — ABNORMAL LOW (ref 3.87–5.11)
RDW: 13.4 % (ref 11.5–15.5)
WBC: 2.8 10*3/uL — AB (ref 4.0–10.5)

## 2014-11-25 MED ORDER — PREDNISONE 50 MG PO TABS
50.0000 mg | ORAL_TABLET | Freq: Every day | ORAL | Status: DC
  Administered 2014-11-26 – 2014-11-27 (×2): 50 mg via ORAL
  Filled 2014-11-25 (×3): qty 1

## 2014-11-25 NOTE — Progress Notes (Signed)
[facility] PROGRESS NOTE  Kathleen Mcdonald UJW:[id number] DOB: 09/01/1981 DOA: 11/22/2014 PCP: Irving CopasHACKER,ROBERT KELLER, MD  Problem List Active Problems:   Ulcerative colitis  Brief HPI 33 year old Caucasian female with a known history of ulcerative colitis presented with fever up to 103F, abdominal pain, diarrhea and blood in stool. She's had 2 doses of Remicade for her ulcerative colitis.  Assessment/Plan:  Ulcerative colitis flare C. difficile negative. She remains on steroids. GI is following. Outpatient Remicade 4 weeks ago. She feels slightly improved today. Continue to monitor. Tolerating orally.  Anxiety Continue Valium, Lexapro  DVT prophylaxis: SCDs Code Status: full Family Communication: Discussed with the patient and her husband. Disposition Plan:  Will return home when improved.   Consultants:  Gastroenterology  Procedures:  None  Antibiotics: None  Subjective: Feels slightly better. Still has some abdominal discomfort. Number of bowel movements everyday, [place], frequency. She is also seeing less blood in stool. No nausea, no vomiting.   Objective: Filed Vitals:   11/24/14 0544 11/24/14 1400 11/24/14 1946 11/25/14 0519  BP: 96/62 129/66 132/78 109/52  Pulse: 50 64 77 80  Temp: 97.6 F (36.4 C) 97.5 F (36.4 C) 98 F (36.7 C) 97.7 F (36.5 C)  TempSrc: Oral Oral Oral Oral  Resp: 16 18 18 18   Height:      Weight:      SpO2: 96% 100% 100% 96%    Intake/Output Summary (Last 24 hours) at 11/25/14 0835 Last data filed at 11/25/14 0519  Gross per 24 hour  Intake   1650 ml  Output   2150 ml  Net   -500 ml    Exam:  General: alert & oriented x 3 In NAD  Cardiovascular: RRR, nl S1 s2  Respiratory: Decreased breath sounds at the bases, scattered rhonchi, no crackles  Abdomen: Abdomen is soft. Minimal vague tenderness diffusely. Without any rebound, rigidity or guarding. Bowel sounds are present. No masses or organomegaly. Extremities: No  cyanosis and no edema   Data Reviewed: Basic Metabolic Panel:  Recent Labs Lab 11/22/14 2125 11/23/14 0658 11/25/14 0505  NA 134* 133* 137  K 3.7 4.0 3.9  CL 102 105 106  CO2 25 24 28   GLUCOSE 134* 129* 137*  BUN <5* <5* 7  CREATININE 0.70 0.66 0.58  CALCIUM 8.8 8.1* 8.3*    Liver Function Tests:  Recent Labs Lab 11/22/14 2125  AST 15  ALT 7  ALKPHOS 53  BILITOT 0.3  PROT 7.4  ALBUMIN 3.1*    Recent Labs Lab 11/22/14 2125  LIPASE 23   CBC:  Recent Labs Lab 11/22/14 2125 11/23/14 0558 11/25/14 0505  WBC 6.7 5.4 2.8*  NEUTROABS 4.4  --   --   HGB 11.5* 11.7* 10.2*  HCT 37.2 37.5 32.3*  MCV 85.1 85.0 85.2  PLT 385 312 311     Recent Results (from the past 240 hour(s))  Clostridium Difficile by PCR     Status: None   Collection Time: 11/23/14  2:51 PM  Result Value Ref Range Status   C difficile by pcr NEGATIVE NEGATIVE Final    Comment[facility]     Studies: Ct Abdomen Pelvis W Contrast  11/23/2014   CLINICAL DATA:  Fever. Diarrhea with bright red blood in stool. Abdominal pain. History of ulcerative colitis.  EXAM: CT ABDOMEN AND PELVIS WITH CONTRAST  TECHNIQUE: Multidetector CT imaging of the abdomen and pelvis was performed using the standard protocol following bolus administration of intravenous contrast.  CONTRAST:  100mL OMNIPAQUE IOHEXOL 300 MG/ML  SOLN  COMPARISON:  10/26/2014  FINDINGS: Dependent atelectasis in the lung bases. No effusions. Heart is normal size.  Low-density area noted adjacent to the falciform ligament in the liver compatible with focal fatty infiltration. Otherwise liver is unremarkable. Gallbladder, spleen, pancreas, adrenals and kidneys are unremarkable. Small nonobstructing stone in the mid to lower pole of the right kidney, stable. No ureteral stones or hydronephrosis.  There is diffuse colonic wall thickening, from the hepatic flexure to the rectosigmoid colon, most pronounced in the sigmoid colon  and descending colon. These findings have progressed since prior study. Mild surrounding inflammatory stranding around the affected colon. Findings likely reflect changes related to patient's known ulcerative colitis. Stomach and small bowel are decompressed. No evidence of obstruction. Retrocecal appendix again noted and unchanged, upper limits normal in diameter.  Mildly prominent pericolonic lymph nodes are stable. Trace free fluid in the pelvis.  Uterus, adnexae and urinary bladder are unremarkable. Aorta is normal caliber.  IMPRESSION: Worsening wall thickening and inflammatory change in the colon from the hepatic flexure to the rectosigmoid colon, most pronounced in the descending colon and sigmoid colon compatible with worsening ulcerative colitis. Stable prominent pericolonic mesenteric lymph nodes.   Electronically Signed   By: Charlett NoseKevin  Dover M.D.   On: 11/23/2014 00:58    Scheduled Meds: . budesonide  9 mg Oral Daily  . escitalopram  20 mg Oral QHS  . ferrous sulfate  325 mg Oral Q breakfast  .  HYDROmorphone (DILAUDID) injection  1 mg Intravenous Once  . methylPREDNISolone (SOLU-MEDROL) injection  60 mg Intravenous 3 times per day  . Norgestimate-Ethinyl Estradiol Triphasic  1 tablet Oral QHS  . pantoprazole  40 mg Oral QHS   Continuous Infusions:   Active Problems:   Ulcerative colitis   Time spent: 25 minutes   [facility],Nihal Doan  [facility] Pager [phone].   If 7PM-7AM, please contact night-coverage at [email or web address], password [email or web address] 11/25/2014, 8:35 AM  LOS: 3 days

## 2014-11-25 NOTE — Progress Notes (Signed)
[facility] Progress Note  Subjective: Doing a little better as far as pain stool frequency and visible blood  Objective: Vital signs in last 24 hours: Temp:  [97.5 F (36.4 C)-98 F (36.7 C)] 97.7 F (36.5 C) (12/30 1000) Pulse Rate:  [64-80] 80 (12/30 1000) Resp:  [18] 18 (12/30 1000) BP: (109-155)/(52-78) 155/65 mmHg (12/30 1000) SpO2:  [96 %-100 %] 98 % (12/30 1000) Weight change:    PE: Mild diffuse tenderness  Lab Results: Results for orders placed or performed during the hospital encounter of 11/22/14 (from the past 24 hour(s))  CBC     Status: Abnormal   Collection Time: 11/25/14  5:05 AM  Result Value Ref Range   WBC 2.8 (L) 4.0 - 10.5 K/uL   RBC 3.79 (L) 3.87 - 5.11 MIL/uL   Hemoglobin 10.2 (L) 12.0 - 15.0 g/dL   HCT 29.532.3 (L) 62.136.0 - 30.846.0 %   MCV 85.2 78.0 - 100.0 fL   MCH 26.9 26.0 - 34.0 pg   MCHC 31.6 30.0 - 36.0 g/dL   RDW 65.713.4 84.611.5 - 96.215.5 %   Platelets 311 150 - 400 K/uL  Basic metabolic panel     Status: Abnormal   Collection Time: 11/25/14  5:05 AM  Result Value Ref Range   Sodium 137 135 - 145 mmol/L   Potassium 3.9 3.5 - 5.1 mmol/L   Chloride 106 96 - 112 mEq/L   CO2 28 19 - 32 mmol/L   Glucose, Bld 137 (H) 70 - 99 mg/dL   BUN 7 6 - 23 mg/dL   Creatinine, Ser 9.520.58 0.50 - 1.10 mg/dL   Calcium 8.3 (L) 8.4 - 10.5 mg/dL   GFR calc non Af Amer >90 >90 mL/min   GFR calc Af Amer >90 >90 mL/min   Anion gap 3 (L) 5 - 15    Studies/Results: No results found.  Stool cultures negative  Assessment: Ulcerative colitis flare despite Remicade. Slowly improving on IV Solu-Medrol  Plan: Slowly advance diet Switch to oral prednisone and taper as necessary. Consider higher dose of Remicade when next due in 2 weeks.    Alexyia Guarino C 11/25/2014, 12:32 PM

## 2014-11-26 DIAGNOSIS — D62 Acute posthemorrhagic anemia: Secondary | ICD-10-CM | POA: Diagnosis present

## 2014-11-26 LAB — CBC WITH DIFFERENTIAL/PLATELET
BASOS ABS: 0 10*3/uL (ref 0.0–0.1)
BASOS PCT: 0 % (ref 0–1)
EOS ABS: 0 10*3/uL (ref 0.0–0.7)
EOS PCT: 1 % (ref 0–5)
HCT: 31.7 % — ABNORMAL LOW (ref 36.0–46.0)
HEMOGLOBIN: 9.8 g/dL — AB (ref 12.0–15.0)
LYMPHS ABS: 2.1 10*3/uL (ref 0.7–4.0)
Lymphocytes Relative: 42 % (ref 12–46)
MCH: 26.6 pg (ref 26.0–34.0)
MCHC: 30.9 g/dL (ref 30.0–36.0)
MCV: 85.9 fL (ref 78.0–100.0)
MONO ABS: 0.8 10*3/uL (ref 0.1–1.0)
Monocytes Relative: 16 % — ABNORMAL HIGH (ref 3–12)
Neutro Abs: 2 10*3/uL (ref 1.7–7.7)
Neutrophils Relative %: 41 % — ABNORMAL LOW (ref 43–77)
Platelets: 334 10*3/uL (ref 150–400)
RBC: 3.69 MIL/uL — ABNORMAL LOW (ref 3.87–5.11)
RDW: 13.5 % (ref 11.5–15.5)
WBC: 4.9 10*3/uL (ref 4.0–10.5)

## 2014-11-26 MED ORDER — MESALAMINE 400 MG PO CPDR
800.0000 mg | DELAYED_RELEASE_CAPSULE | Freq: Three times a day (TID) | ORAL | Status: DC
  Administered 2014-11-26 – 2014-12-03 (×23): 800 mg via ORAL
  Filled 2014-11-26 (×24): qty 2

## 2014-11-26 NOTE — Progress Notes (Signed)
[facility] PROGRESS NOTE  Caryl AdaJennifer S Cecilio ZOX:[id number] DOB: 06/04/1981 DOA: 11/22/2014 PCP: Irving CopasHACKER,ROBERT KELLER, MD  Problem List Active Problems:   Ulcerative colitis   Acute blood loss anemia  Brief HPI 33 year old Caucasian female with a known history of ulcerative colitis presented with fever up to 103F, abdominal pain, diarrhea and blood in stool. She's had 2 doses of Remicade for her ulcerative colitis.  Assessment/Plan:  Ulcerative colitis flare C. difficile negative. She remains on steroids. GI is following. Outpatient Remicade 4 weeks ago. Not much improvement in number of BM's but less bleeding. Continue to monitor. Tolerating orally.   Anemia secondary to acute blood loss. Hemoglobin slightly lower today. No need for transfusion at this time. Continue to monitor.  Anxiety Continue Valium, Lexapro  DVT prophylaxis: SCDs Code Status: full Family Communication: Discussed with the patient and her husband. Disposition Plan:  Will return home when improved.   Consultants:  Gastroenterology  Procedures:  None  Antibiotics: None  Subjective: Frequency of diarrhea remains the same. Less bleeding. Continues to have cramping and abdominal pain. Denies nausea, vomiting.  Objective: Filed Vitals:   11/25/14 1000 11/25/14 1400 11/25/14 2139 11/26/14 0502  BP: 155/65 120/50 129/80 113/75  Pulse: 80 78 66 59  Temp: 97.7 F (36.5 C) 97.6 F (36.4 C) 97.8 F (36.6 C) 98 F (36.7 C)  TempSrc: Oral Oral Oral Oral  Resp: 18 18 18 18   Height:      Weight:      SpO2: 98% 99% 92% 96%    Intake/Output Summary (Last 24 hours) at 11/26/14 1304 Last data filed at 11/26/14 1000  Gross per 24 hour  Intake    480 ml  Output    850 ml  Net   -370 ml    Exam:  General: alert & oriented x 3 In NAD  Cardiovascular: RRR, nl S1 s2  Respiratory: Decreased breath sounds at the bases, scattered rhonchi, no crackles  Abdomen: Abdomen remains soft. Minimal vague  tenderness diffusely. Without any rebound, rigidity or guarding. Bowel sounds are present. No masses or organomegaly. Extremities: No cyanosis and no edema   Data Reviewed: Basic Metabolic Panel:  Recent Labs Lab 11/22/14 2125 11/23/14 0658 11/25/14 0505  NA 134* 133* 137  K 3.7 4.0 3.9  CL 102 105 106  CO2 25 24 28   GLUCOSE 134* 129* 137*  BUN <5* <5* 7  CREATININE 0.70 0.66 0.58  CALCIUM 8.8 8.1* 8.3*    Liver Function Tests:  Recent Labs Lab 11/22/14 2125  AST 15  ALT 7  ALKPHOS 53  BILITOT 0.3  PROT 7.4  ALBUMIN 3.1*    Recent Labs Lab 11/22/14 2125  LIPASE 23   CBC:  Recent Labs Lab 11/22/14 2125 11/23/14 0558 11/25/14 0505 11/26/14 0458  WBC 6.7 5.4 2.8* 4.9  NEUTROABS 4.4  --   --  2.0  HGB 11.5* 11.7* 10.2* 9.8*  HCT 37.2 37.5 32.3* 31.7*  MCV 85.1 85.0 85.2 85.9  PLT 385 312 311 334     Recent Results (from the past 240 hour(s))  Stool culture     Status: None (Preliminary result)   Collection Time: 11/23/14  2:51 PM  Result Value Ref Range Status   Specimen Description STOOL  Final   Special Requests NONE  Final   Culture   Final    NO SUSPICIOUS COLONIES, CONTINUING TO HOLD [facility]formed at [facility]    Report Status PENDING  Incomplete  Clostridium Difficile by PCR  Status: None   Collection Time: 11/23/14  2:51 PM  Result Value Ref Range Status   C difficile by pcr NEGATIVE NEGATIVE Final    Comment: [facility]     Studies: Ct Abdomen Pelvis W Contrast  11/23/2014   CLINICAL DATA:  Fever. Diarrhea with bright red blood in stool. Abdominal pain. History of ulcerative colitis.  EXAM: CT ABDOMEN AND PELVIS WITH CONTRAST  TECHNIQUE: Multidetector CT imaging of the abdomen and pelvis was performed using the standard protocol following bolus administration of intravenous contrast.  CONTRAST:  100mL OMNIPAQUE IOHEXOL 300 MG/ML  SOLN  COMPARISON:  10/26/2014  FINDINGS: Dependent atelectasis in the lung  bases. No effusions. Heart is normal size.  Low-density area noted adjacent to the falciform ligament in the liver compatible with focal fatty infiltration. Otherwise liver is unremarkable. Gallbladder, spleen, pancreas, adrenals and kidneys are unremarkable. Small nonobstructing stone in the mid to lower pole of the right kidney, stable. No ureteral stones or hydronephrosis.  There is diffuse colonic wall thickening, from the hepatic flexure to the rectosigmoid colon, most pronounced in the sigmoid colon and descending colon. These findings have progressed since prior study. Mild surrounding inflammatory stranding around the affected colon. Findings likely reflect changes related to patient's known ulcerative colitis. Stomach and small bowel are decompressed. No evidence of obstruction. Retrocecal appendix again noted and unchanged, upper limits normal in diameter.  Mildly prominent pericolonic lymph nodes are stable. Trace free fluid in the pelvis.  Uterus, adnexae and urinary bladder are unremarkable. Aorta is normal caliber.  IMPRESSION: Worsening wall thickening and inflammatory change in the colon from the hepatic flexure to the rectosigmoid colon, most pronounced in the descending colon and sigmoid colon compatible with worsening ulcerative colitis. Stable prominent pericolonic mesenteric lymph nodes.   Electronically Signed   By: Charlett NoseKevin  Dover M.D.   On: 11/23/2014 00:58    Scheduled Meds: . budesonide  9 mg Oral Daily  . escitalopram  20 mg Oral QHS  . ferrous sulfate  325 mg Oral Q breakfast  .  HYDROmorphone (DILAUDID) injection  1 mg Intravenous Once  . Mesalamine  800 mg Oral TID  . Norgestimate-Ethinyl Estradiol Triphasic  1 tablet Oral QHS  . pantoprazole  40 mg Oral QHS  . predniSONE  50 mg Oral Q breakfast   Continuous Infusions:   Active Problems:   Ulcerative colitis   Acute blood loss anemia   Time spent: 25 minutes   [facility]KRISHNAN,Quint Chestnut  [facility] Pager [phone].    If 7PM-7AM, please contact night-coverage at [email or web address], password [email or web address] 11/26/2014, 1:04 PM  LOS: 4 days

## 2014-11-26 NOTE — Progress Notes (Signed)
[facility] Progress Note  Subjective: Less blood but stools still frequent and abdominal cramping about the same  Objective: Vital signs in last 24 hours: Temp:  [97.6 F (36.4 C)-98 F (36.7 C)] 98 F (36.7 C) (12/31 0502) Pulse Rate:  [59-80] 59 (12/31 0502) Resp:  [18] 18 (12/31 0502) BP: (113-155)/(50-80) 113/75 mmHg (12/31 0502) SpO2:  [92 %-99 %] 96 % (12/31 0502) Weight change:    PE: Still mild to moderate abdominal tenderness  Lab Results: Results for orders placed or performed during the hospital encounter of 11/22/14 (from the past 24 hour(s))  CBC with Differential     Status: Abnormal   Collection Time: 11/26/14  4:58 AM  Result Value Ref Range   WBC 4.9 4.0 - 10.5 K/uL   RBC 3.69 (L) 3.87 - 5.11 MIL/uL   Hemoglobin 9.8 (L) 12.0 - 15.0 g/dL   HCT 81.131.7 (L) 91.436.0 - 78.246.0 %   MCV 85.9 78.0 - 100.0 fL   MCH 26.6 26.0 - 34.0 pg   MCHC 30.9 30.0 - 36.0 g/dL   RDW 95.613.5 21.311.5 - 08.615.5 %   Platelets 334 150 - 400 K/uL   Neutrophils Relative % 41 (L) 43 - 77 %   Neutro Abs 2.0 1.7 - 7.7 K/uL   Lymphocytes Relative 42 12 - 46 %   Lymphs Abs 2.1 0.7 - 4.0 K/uL   Monocytes Relative 16 (H) 3 - 12 %   Monocytes Absolute 0.8 0.1 - 1.0 K/uL   Eosinophils Relative 1 0 - 5 %   Eosinophils Absolute 0.0 0.0 - 0.7 K/uL   Basophils Relative 0 0 - 1 %   Basophils Absolute 0.0 0.0 - 0.1 K/uL    Studies/Results: No results found.    Assessment: Ulcerative colitis with flare on Remicade and currently on tapering course of prednisone, due for her next Remicade in 2 weeks.  Plan: Continue prednisone and will add mesalamine since she is only been on it once. If cannot get improved enough for discharge soon we'll consider doing another Remicade injection possibly at 10 mg/kg    Kathleen Mcdonald C 11/26/2014, 9:00 AM

## 2014-11-27 HISTORY — PX: COLECTOMY: SHX59

## 2014-11-27 LAB — BASIC METABOLIC PANEL
ANION GAP: 9 (ref 5–15)
BUN: 8 mg/dL (ref 6–23)
CO2: 31 mmol/L (ref 19–32)
CREATININE: 0.69 mg/dL (ref 0.50–1.10)
Calcium: 8.5 mg/dL (ref 8.4–10.5)
Chloride: 101 mEq/L (ref 96–112)
GFR calc Af Amer: >90 mL/min (ref >90)
GFR calc non Af Amer: >90 mL/min (ref >90)
GLUCOSE: 92 mg/dL (ref 70–99)
POTASSIUM: 3.4 mmol/L — AB (ref 3.5–5.1)
SODIUM: 141 mmol/L (ref 135–145)

## 2014-11-27 LAB — CBC
HEMATOCRIT: 34.8 % — AB (ref 36.0–46.0)
Hemoglobin: 10.7 g/dL — ABNORMAL LOW (ref 12.0–15.0)
MCH: 26 pg (ref 26.0–34.0)
MCHC: 30.7 g/dL (ref 30.0–36.0)
MCV: 84.7 fL (ref 78.0–100.0)
Platelets: 331 10*3/uL (ref 150–400)
RBC: 4.11 MIL/uL (ref 3.87–5.11)
RDW: 13 % (ref 11.5–15.5)
WBC: 4.8 10*3/uL (ref 4.0–10.5)

## 2014-11-27 MED ORDER — METHYLPREDNISOLONE SODIUM SUCC 125 MG IJ SOLR
60.0000 mg | Freq: Four times a day (QID) | INTRAMUSCULAR | Status: DC
  Administered 2014-11-27 – 2014-12-03 (×25): 60 mg via INTRAVENOUS
  Filled 2014-11-27 (×28): qty 0.96

## 2014-11-27 MED ORDER — POTASSIUM CHLORIDE CRYS ER 20 MEQ PO TBCR
40.0000 meq | EXTENDED_RELEASE_TABLET | Freq: Once | ORAL | Status: AC
  Administered 2014-11-27: 40 meq via ORAL
  Filled 2014-11-27: qty 2

## 2014-11-27 NOTE — Progress Notes (Signed)
[facility] Progress Note  Subjective: And see if stools of increased since descending IV Solu-Medrol still tolerating a bland diet  Objective: Vital signs in last 24 hours: Temp:  [98 F (36.7 C)-98.4 F (36.9 C)] 98.4 F (36.9 C) (01/01 1400) Pulse Rate:  [52-83] 83 (01/01 1400) Resp:  [16-18] 18 (01/01 1400) BP: (121-129)/(79-82) 129/82 mmHg (01/01 1400) SpO2:  [97 %-99 %] 99 % (01/01 1400) Weight change:    PE: Abdomen soft mildly tender  Lab Results: Results for orders placed or performed during the hospital encounter of 11/22/14 (from the past 24 hour(s))  CBC     Status: Abnormal   Collection Time: 11/27/14  5:25 AM  Result Value Ref Range   WBC 4.8 4.0 - 10.5 K/uL   RBC 4.11 3.87 - 5.11 MIL/uL   Hemoglobin 10.7 (L) 12.0 - 15.0 g/dL   HCT 04.5 (L) 40.9 - 81.1 %   MCV 84.7 78.0 - 100.0 fL   MCH 26.0 26.0 - 34.0 pg   MCHC 30.7 30.0 - 36.0 g/dL   RDW 91.4 78.2 - 95.6 %   Platelets 331 150 - 400 K/uL  Basic metabolic panel     Status: Abnormal   Collection Time: 11/27/14  5:25 AM  Result Value Ref Range   Sodium 141 135 - 145 mmol/L   Potassium 3.4 (L) 3.5 - 5.1 mmol/L   Chloride 101 96 - 112 mEq/L   CO2 31 19 - 32 mmol/L   Glucose, Bld 92 70 - 99 mg/dL   BUN 8 6 - 23 mg/dL   Creatinine, Ser 2.13 0.50 - 1.10 mg/dL   Calcium 8.5 8.4 - 08.6 mg/dL   GFR calc non Af Amer >90 >90 mL/min   GFR calc Af Amer >90 >90 mL/min   Anion gap 9 5 - 15    Studies/Results: No results found.    Assessment: Steroid refractory ulcerative colitis  Plan: Long talk with the patient husband and mother, will restart IV steroids consider increasing Remicade versus considering this a treatment failure and [facility] either as outpatient if we can get her off IVs or possibly hospital to hospital transfer.    Libbey Duce C 11/27/2014, 2:16 PM

## 2014-11-27 NOTE — Progress Notes (Signed)
[facility] PROGRESS NOTE  Kathleen Mcdonald:[id number] DOB: Jan 03, 1981 DOA: 11/22/2014 PCP: Irving Copas, MD  Problem List Active Problems:   Ulcerative colitis   Acute blood loss anemia  Brief HPI 34 year old Caucasian female with a known history of ulcerative colitis presented with fever up to 103F, abdominal pain, diarrhea and blood in stool. She's had 2 doses of Remicade for her ulcerative colitis. Slow to improve.  Assessment/Plan:  Ulcerative colitis flare Continues to have frequent diarrhea which is small in quantity. Over the last 24 hours. She is also seen more blood in her stool. C. difficile negative. She remains on steroids. GI is following. Outpatient Remicade 4 weeks ago. Continue to monitor. Tolerating orally. Replace potassium  Anemia secondary to acute blood loss. Hemoglobin low but stable. No need for transfusion at this time. Continue to monitor.  Anxiety Continue Valium, Lexapro  DVT prophylaxis: SCDs Code Status: full Family Communication: Discussed with the patient and her husband. Disposition Plan:  Will return home when improved.   Consultants:  Gastroenterology  Procedures:  None  Antibiotics: None  Subjective: Had 15 bowel movements yesterday which were small in quantity. Half of them had blood in them. This is an increase from the day before. Continues to have some abdominal discomfort. No nausea, vomiting.  Objective: Filed Vitals:   11/26/14 0502 11/26/14 1400 11/26/14 2120 11/27/14 0550  BP: 113/75 130/98 123/79 121/80  Pulse: 59 70 59 52  Temp: 98 F (36.7 C)  98.4 F (36.9 C) 98 F (36.7 C)  TempSrc: Oral  Oral Oral  Resp: Height:      Weight:      SpO2: 96% 96% 97% 97%    Intake/Output Summary (Last 24 hours) at 11/27/14 0919 Last data filed at 11/26/14 2100  Gross per 24 hour  Intake    240 ml  Output    650 ml  Net   -410 ml    Exam:  General: alert & oriented x 3 In NAD   Cardiovascular: RRR, nl S1 s2  Respiratory: Decreased breath sounds at the bases, scattered rhonchi, no crackles  Abdomen: Abdomen remains soft. Minimal vague tenderness diffusely. Without any rebound, rigidity or guarding. Bowel sounds are present. No masses or organomegaly. Extremities: No cyanosis and no edema   Data Reviewed: Basic Metabolic Panel:  Recent Labs Lab 11/22/14 2125 11/23/14 0658 11/25/14 0505 11/27/14 0525  NA 134* 133* 137 141  K 3.7 4.0 3.9 3.4*  CL 102 105 106 101  CO2 GLUCOSE 134* 129* 137* 92  BUN <5* <5* 7 8  CREATININE 0.70 0.66 0.58 0.69  CALCIUM 8.8 8.1* 8.3* 8.5    Liver Function Tests:  Recent Labs Lab 11/22/14 2125  AST 15  ALT 7  ALKPHOS 53  BILITOT 0.3  PROT 7.4  ALBUMIN 3.1*    Recent Labs Lab 11/22/14 2125  LIPASE 23   CBC:  Recent Labs Lab 11/22/14 2125 11/23/14 0558 11/25/14 0505 11/26/14 0458 11/27/14 0525  WBC 6.7 5.4 2.8* 4.9 4.8  NEUTROABS 4.4  --   --  2.0  --   HGB 11.5* 11.7* 10.2* 9.8* 10.7*  HCT 37.2 37.5 32.3* 31.7* 34.8*  MCV 85.1 85.0 85.2 85.9 84.7  PLT 385 312 311 334 331     Recent Results (from the past 240 hour(s))  Stool culture     Status: None (Preliminary result)   Collection Time: 11/23/14  2:51 PM  Result  Value Ref Range Status   Specimen Description STOOL  Final   Special Requests NONE  Final   Culture   Final    NO SUSPICIOUS COLONIES, CONTINUING TO HOLD Performed at [facility]    Report Status PENDING  Incomplete  Clostridium Difficile by PCR     Status: None   Collection Time: 11/23/14  2:51 PM  Result Value Ref Range Status   C difficile by pcr NEGATIVE NEGATIVE Final    Comment: Performed at [facility]     Studies: Ct Abdomen Pelvis W Contrast  11/23/2014   CLINICAL DATA:  Fever. Diarrhea with bright red blood in stool. Abdominal pain. History of ulcerative colitis.  EXAM: CT ABDOMEN AND PELVIS WITH CONTRAST  TECHNIQUE: Multidetector CT  imaging of the abdomen and pelvis was performed using the standard protocol following bolus administration of intravenous contrast.  CONTRAST:  OMNIPAQUE IOHEXOL 300 MG/ML  SOLN  COMPARISON:  10/26/2014  FINDINGS: Dependent atelectasis in the lung bases. No effusions. Heart is normal size.  Low-density area noted adjacent to the falciform ligament in the liver compatible with focal fatty infiltration. Otherwise liver is unremarkable. Gallbladder, spleen, pancreas, adrenals and kidneys are unremarkable. Small nonobstructing stone in the mid to lower pole of the right kidney, stable. No ureteral stones or hydronephrosis.  There is diffuse colonic wall thickening, from the hepatic flexure to the rectosigmoid colon, most pronounced in the sigmoid colon and descending colon. These findings have progressed since prior study. Mild surrounding inflammatory stranding around the affected colon. Findings likely reflect changes related to patient's known ulcerative colitis. Stomach and small bowel are decompressed. No evidence of obstruction. Retrocecal appendix again noted and unchanged, upper limits normal in diameter.  Mildly prominent pericolonic lymph nodes are stable. Trace free fluid in the pelvis.  Uterus, adnexae and urinary bladder are unremarkable. Aorta is normal caliber.  IMPRESSION: Worsening wall thickening and inflammatory change in the colon from the hepatic flexure to the rectosigmoid colon, most pronounced in the descending colon and sigmoid colon compatible with worsening ulcerative colitis. Stable prominent pericolonic mesenteric lymph nodes.   Electronically Signed   By: Charlett Nose M.D.   On: 11/23/2014 00:58    Scheduled Meds: . budesonide  9 mg Oral Daily  . escitalopram  20 mg Oral QHS  . ferrous sulfate  325 mg Oral Q breakfast  .  HYDROmorphone (DILAUDID) injection  1 mg Intravenous Once  . Mesalamine  800 mg Oral TID  . Norgestimate-Ethinyl Estradiol Triphasic  1 tablet Oral QHS  .  pantoprazole  40 mg Oral QHS  . potassium chloride  40 mEq Oral Once  . predniSONE  50 mg Oral Q breakfast   Continuous Infusions:   Active Problems:   Ulcerative colitis   Acute blood loss anemia   Time spent: 25 minutes   [facility]  [facility] Pager [phone].   If 7PM-7AM, please contact night-coverage at [email or web address], password [facility] 11/27/2014, 9:19 AM  LOS: 5 days

## 2014-11-28 LAB — BASIC METABOLIC PANEL
ANION GAP: 9 (ref 5–15)
BUN: 10 mg/dL (ref 6–23)
CALCIUM: 8.8 mg/dL (ref 8.4–10.5)
CO2: 28 mmol/L (ref 19–32)
Chloride: 100 mEq/L (ref 96–112)
Creatinine, Ser: 0.62 mg/dL (ref 0.50–1.10)
GFR calc non Af Amer: >90 mL/min (ref >90)
GLUCOSE: 152 mg/dL — AB (ref 70–99)
Potassium: 4 mmol/L (ref 3.5–5.1)
SODIUM: 137 mmol/L (ref 135–145)

## 2014-11-28 LAB — CBC
HCT: 35.6 % — ABNORMAL LOW (ref 36.0–46.0)
Hemoglobin: 11.9 g/dL — ABNORMAL LOW (ref 12.0–15.0)
MCH: 27.9 pg (ref 26.0–34.0)
MCHC: 33.4 g/dL (ref 30.0–36.0)
MCV: 83.6 fL (ref 78.0–100.0)
Platelets: 353 10*3/uL (ref 150–400)
RBC: 4.26 MIL/uL (ref 3.87–5.11)
RDW: 13 % (ref 11.5–15.5)
WBC: 4.2 10*3/uL (ref 4.0–10.5)

## 2014-11-28 MED ORDER — SODIUM CHLORIDE 0.9 % IV SOLN
10.0000 mg/kg | Freq: Once | INTRAVENOUS | Status: AC
  Administered 2014-11-28 (×2): 800 mg via INTRAVENOUS
  Filled 2014-11-28: qty 80

## 2014-11-28 MED ORDER — ACETAMINOPHEN 325 MG PO TABS
650.0000 mg | ORAL_TABLET | Freq: Once | ORAL | Status: AC
  Administered 2014-11-28: 650 mg via ORAL
  Filled 2014-11-28: qty 2

## 2014-11-28 MED ORDER — DIPHENHYDRAMINE HCL 25 MG PO CAPS
25.0000 mg | ORAL_CAPSULE | Freq: Once | ORAL | Status: AC
  Administered 2014-11-28: 25 mg via ORAL
  Filled 2014-11-28: qty 1

## 2014-11-28 NOTE — Progress Notes (Signed)
[facility] Progress Note  Subjective: Patient doing about the same since starting Solu-Medrol  Objective: Vital signs in last 24 hours: Temp:  [98.4 F (36.9 C)-98.8 F (37.1 C)] 98.4 F (36.9 C) (01/02 0619) Pulse Rate:  [61-83] 61 (01/02 0619) Resp:  [18] 18 (01/02 0619) BP: (112-129)/(68-82) 112/68 mmHg (01/02 0619) SpO2:  [96 %-99 %] 96 % (01/02 0619) Weight change:    PE: Unchanged  Lab Results: Results for orders placed or performed during the hospital encounter of 11/22/14 (from the past 24 hour(s))  CBC     Status: Abnormal   Collection Time: 11/28/14  5:04 AM  Result Value Ref Range   WBC 4.2 4.0 - 10.5 K/uL   RBC 4.26 3.87 - 5.11 MIL/uL   Hemoglobin 11.9 (L) 12.0 - 15.0 g/dL   HCT 04.5 (L) 40.9 - 81.1 %   MCV 83.6 78.0 - 100.0 fL   MCH 27.9 26.0 - 34.0 pg   MCHC 33.4 30.0 - 36.0 g/dL   RDW 91.4 78.2 - 95.6 %   Platelets 353 150 - 400 K/uL  Basic metabolic panel     Status: Abnormal   Collection Time: 11/28/14  5:04 AM  Result Value Ref Range   Sodium 137 135 - 145 mmol/L   Potassium 4.0 3.5 - 5.1 mmol/L   Chloride 100 96 - 112 mEq/L   CO2 28 19 - 32 mmol/L   Glucose, Bld 152 (H) 70 - 99 mg/dL   BUN 10 6 - 23 mg/dL   Creatinine, Ser 2.13 0.50 - 1.10 mg/dL   Calcium 8.8 8.4 - 08.6 mg/dL   GFR calc non Af Amer >90 >90 mL/min   GFR calc Af Amer >90 >90 mL/min   Anion gap 9 5 - 15    Studies/Results: No results found.    Assessment: Steroid refractory ulcerative colitis  Plan: Will administer Remicade at 10 mg/kg today with Benadryl and Tylenol premedication. Probably start 6-MP tomorrow. Continue Solu-Medrol and will probably withdraw Entocort and 5-ASA over the next 2 or 3 days if she responds to Remicade   Kailiana Granquist C 11/28/2014, 9:05 AM

## 2014-11-28 NOTE — Progress Notes (Signed)
[facility] PROGRESS NOTE  Kathleen Mcdonald ZOX:[id number] DOB: 04/27/1981 DOA: 11/22/2014 PCP: Irving Copas, MD  Problem List Active Problems:   Ulcerative colitis   Acute blood loss anemia  Brief HPI 34 year old Caucasian female with a known history of ulcerative colitis presented with fever up to 103F, abdominal pain, diarrhea and blood in stool. She's had 2 doses of Remicade for her ulcerative colitis. Slow to improve.  Assessment/Plan:  Ulcerative colitis flare She was placed back on intravenous Solu-Medrol yesterday by GI. Reports some improvement in frequency of diarrhea and the bleeding. C. difficile negative. GI is following. Outpatient Remicade 4 weeks ago. GI is considering giving her additional dose of Remicade and starting 6-MP tomorrow. If she does not improve as anticipated in the next few days she may have to be transferred to an academic center.   Anemia secondary to acute blood loss. Hemoglobin low but stable. No need for transfusion at this time. Continue to monitor.  Anxiety Continue Valium, Lexapro  DVT prophylaxis: SCDs Code Status: full Family Communication: Discussed with the patient and her husband. Disposition Plan:  Await improvement in diarrhea and bleeding.   Consultants:  Gastroenterology  Procedures:  None  Antibiotics: None  Subjective: She had 10 bowel movements in the last 24 hours. Bleeding has reduced. Continues to have abdominal discomfort and cramping. No nausea, vomiting.   Objective: Filed Vitals:   11/27/14 0550 11/27/14 1400 11/27/14 2147 11/28/14 0619  BP: 121/80 129/82 129/77 112/68  Pulse: 52 83 66 61  Temp: 98 F (36.7 C) 98.4 F (36.9 C) 98.8 F (37.1 C) 98.4 F (36.9 C)  TempSrc: Oral Oral Oral Oral  Resp: Height:      Weight:      SpO2: 97% 99% 96% 96%    Intake/Output Summary (Last 24 hours) at 11/28/14 0919 Last data filed at 11/28/14 0600  Gross per 24 hour  Intake   6510 ml   Output   1090 ml  Net   5420 ml    Exam:  General: alert & oriented x 3 In NAD  Cardiovascular: RRR, nl S1 s2  Respiratory: Decreased breath sounds at the bases, scattered rhonchi, no crackles  Abdomen: Abdomen remains soft. Minimal vague tenderness diffusely. Without any rebound, rigidity or guarding. Bowel sounds are present. No masses or organomegaly. Extremities: No cyanosis and no edema   Data Reviewed: Basic Metabolic Panel:  Recent Labs Lab 11/22/14 2125 11/23/14 0658 11/25/14 0505 11/27/14 0525 11/28/14 0504  NA 134* 133* 137 141 137  K 3.7 4.0 3.9 3.4* 4.0  CL 102 105 106 101 100  CO2 GLUCOSE 134* 129* 137* 92 152*  BUN <5* <5* CREATININE 0.70 0.66 0.58 0.69 0.62  CALCIUM 8.8 8.1* 8.3* 8.5 8.8    Liver Function Tests:  Recent Labs Lab 11/22/14 2125  AST 15  ALT 7  ALKPHOS 53  BILITOT 0.3  PROT 7.4  ALBUMIN 3.1*    Recent Labs Lab 11/22/14 2125  LIPASE 23   CBC:  Recent Labs Lab 11/22/14 2125 11/23/14 0558 11/25/14 0505 11/26/14 0458 11/27/14 0525 11/28/14 0504  WBC 6.7 5.4 2.8* 4.9 4.8 4.2  NEUTROABS 4.4  --   --  2.0  --   --   HGB 11.5* 11.7* 10.2* 9.8* 10.7* 11.9*  HCT 37.2 37.5 32.3* 31.7* 34.8* 35.6*  MCV 85.1 85.0 85.2 85.9 84.7 83.6  PLT 385 312 311 334 331  353     Recent Results (from the past 240 hour(s))  Stool culture     Status: None (Preliminary result)   Collection Time: 11/23/14  2:51 PM  Result Value Ref Range Status   Specimen Description STOOL  Final   Special Requests NONE  Final   Culture   Final    NO SUSPICIOUS COLONIES, CONTINUING TO HOLD Performed at [facility]    Report Status PENDING  Incomplete  Clostridium Difficile by PCR     Status: None   Collection Time: 11/23/14  2:51 PM  Result Value Ref Range Status   C difficile by pcr NEGATIVE NEGATIVE Final    Comment: Performed at [facility]     Studies: Ct Abdomen Pelvis W Contrast  11/23/2014    CLINICAL DATA:  Fever. Diarrhea with bright red blood in stool. Abdominal pain. History of ulcerative colitis.  EXAM: CT ABDOMEN AND PELVIS WITH CONTRAST  TECHNIQUE: Multidetector CT imaging of the abdomen and pelvis was performed using the standard protocol following bolus administration of intravenous contrast.  CONTRAST:  OMNIPAQUE IOHEXOL 300 MG/ML  SOLN  COMPARISON:  10/26/2014  FINDINGS: Dependent atelectasis in the lung bases. No effusions. Heart is normal size.  Low-density area noted adjacent to the falciform ligament in the liver compatible with focal fatty infiltration. Otherwise liver is unremarkable. Gallbladder, spleen, pancreas, adrenals and kidneys are unremarkable. Small nonobstructing stone in the mid to lower pole of the right kidney, stable. No ureteral stones or hydronephrosis.  There is diffuse colonic wall thickening, from the hepatic flexure to the rectosigmoid colon, most pronounced in the sigmoid colon and descending colon. These findings have progressed since prior study. Mild surrounding inflammatory stranding around the affected colon. Findings likely reflect changes related to patient's known ulcerative colitis. Stomach and small bowel are decompressed. No evidence of obstruction. Retrocecal appendix again noted and unchanged, upper limits normal in diameter.  Mildly prominent pericolonic lymph nodes are stable. Trace free fluid in the pelvis.  Uterus, adnexae and urinary bladder are unremarkable. Aorta is normal caliber.  IMPRESSION: Worsening wall thickening and inflammatory change in the colon from the hepatic flexure to the rectosigmoid colon, most pronounced in the descending colon and sigmoid colon compatible with worsening ulcerative colitis. Stable prominent pericolonic mesenteric lymph nodes.   Electronically Signed   By: Charlett Nose M.D.   On: 11/23/2014 00:58    Scheduled Meds: . acetaminophen  650 mg Oral Once  . budesonide  9 mg Oral Daily  . diphenhydrAMINE   25 mg Oral Once  . escitalopram  20 mg Oral QHS  . ferrous sulfate  325 mg Oral Q breakfast  .  HYDROmorphone (DILAUDID) injection  1 mg Intravenous Once  . inFLIXimab (REMICADE) infusion  10 mg/kg Intravenous Once  . Mesalamine  800 mg Oral TID  . methylPREDNISolone (SOLU-MEDROL) injection  60 mg Intravenous Q6H  . Norgestimate-Ethinyl Estradiol Triphasic  1 tablet Oral QHS  . pantoprazole  40 mg Oral QHS   Continuous Infusions:   Active Problems:   Ulcerative colitis   Acute blood loss anemia   Time spent: 25 minutes   [facility]  [facility] Pager [phone].   If 7PM-7AM, please contact night-coverage at [email or web address], password [facility] 11/28/2014, 9:19 AM  LOS: 6 days

## 2014-11-29 LAB — STOOL CULTURE

## 2014-11-29 LAB — BASIC METABOLIC PANEL
Anion gap: 7 (ref 5–15)
BUN: 15 mg/dL (ref 6–23)
CHLORIDE: 103 meq/L (ref 96–112)
CO2: 27 mmol/L (ref 19–32)
Calcium: 8.8 mg/dL (ref 8.4–10.5)
Creatinine, Ser: 0.71 mg/dL (ref 0.50–1.10)
Glucose, Bld: 141 mg/dL — ABNORMAL HIGH (ref 70–99)
POTASSIUM: 3.9 mmol/L (ref 3.5–5.1)
Sodium: 137 mmol/L (ref 135–145)

## 2014-11-29 LAB — CBC WITH DIFFERENTIAL/PLATELET
BASOS ABS: 0 10*3/uL (ref 0.0–0.1)
Basophils Relative: 0 % (ref 0–1)
EOS PCT: 0 % (ref 0–5)
Eosinophils Absolute: 0 10*3/uL (ref 0.0–0.7)
HCT: 37.3 % (ref 36.0–46.0)
HEMOGLOBIN: 11.7 g/dL — AB (ref 12.0–15.0)
LYMPHS PCT: 9 % — AB (ref 12–46)
Lymphs Abs: 0.7 10*3/uL (ref 0.7–4.0)
MCH: 26.5 pg (ref 26.0–34.0)
MCHC: 31.4 g/dL (ref 30.0–36.0)
MCV: 84.4 fL (ref 78.0–100.0)
MONOS PCT: 3 % (ref 3–12)
Monocytes Absolute: 0.2 10*3/uL (ref 0.1–1.0)
Neutro Abs: 6.7 10*3/uL (ref 1.7–7.7)
Neutrophils Relative %: 88 % — ABNORMAL HIGH (ref 43–77)
Platelets: 396 10*3/uL (ref 150–400)
RBC: 4.42 MIL/uL (ref 3.87–5.11)
RDW: 13.3 % (ref 11.5–15.5)
WBC: 7.6 10*3/uL (ref 4.0–10.5)

## 2014-11-29 MED ORDER — MERCAPTOPURINE 50 MG PO TABS
1.5000 mg/kg | ORAL_TABLET | Freq: Every day | ORAL | Status: DC
  Administered 2014-11-29 – 2014-12-03 (×5): 100 mg via ORAL
  Filled 2014-11-29 (×5): qty 2

## 2014-11-29 NOTE — Progress Notes (Signed)
[facility] PROGRESS NOTE  Kathleen Mcdonald ZOX:[id number] DOB: 09/08/1981 DOA: 11/22/2014 PCP: Irving Copas, MD  Problem List Active Problems:   Ulcerative colitis   Acute blood loss anemia  Brief HPI 34 year old Caucasian female with a known history of ulcerative colitis presented with fever up to 103F, abdominal pain, diarrhea and blood in stool. She's had 2 doses of Remicade as an outpatient for her ulcerative colitis. Asian is very slow to improve. She did not tolerate being placed on oral steroids. Intravenous steroids was reinitiated. She was given a dose of Remicade in the hospital. She was started on 6-MP.  Assessment/Plan:  Ulcerative colitis flare refractory to steroids. She was placed back on intravenous Solu-Medrol by GI. She was given a dose of Remicade yesterday. 6 MP has been initiated today. She had 6 bowel movements yesterday. Not any bleeding in the stools. Abdominal cramping continues. If she does not improve as anticipated in the next few days she may have to be transferred to an academic center. If she does improve, she'll still need to be referred as outpatient for surgery.  Anemia secondary to acute blood loss. Hemoglobin low but stable. No need for transfusion at this time. Continue to monitor.  Anxiety Continue Valium, Lexapro  DVT prophylaxis: SCDs Code Status: full Family Communication: Discussed with the patient. Disposition Plan:  Await improvement in diarrhea and bleeding.   Consultants:  Gastroenterology  Procedures:  None  Antibiotics: None  Subjective: She had 6-7 bowel movements in the last 24 hours. Bleeding is reduced. However, continues to have abdominal discomfort and cramping. No nausea or vomiting. Feels weak.   Objective: Filed Vitals:   11/28/14 1332 11/28/14 1403 11/28/14 2142 11/29/14 0610  BP: 129/86 126/75 128/79 130/79  Pulse: 71 65 58 50  Temp: 98.7 F (37.1 C) 98.8 F (37.1 C) 97.9 F (36.6 C) 98.1 F  (36.7 C)  TempSrc:  Oral Oral Oral  Resp: 18 18 18 18   Height:      Weight:      SpO2:  98% 95% 95%    Intake/Output Summary (Last 24 hours) at 11/29/14 8119 Last data filed at 11/29/14 1478  Gross per 24 hour  Intake   1440 ml  Output   1400 ml  Net     40 ml    Exam:  General: alert & oriented x 3 In NAD  Cardiovascular: RRR, nl S1 s2  Respiratory: Decreased breath sounds at the bases. Clear to auscultation. Abdomen: Abdomen remains soft. Minimal vague tenderness diffusely. Without any rebound, rigidity or guarding. Bowel sounds are present. No masses or organomegaly. Extremities: No cyanosis and no edema   Data Reviewed: Basic Metabolic Panel:  Recent Labs Lab 11/23/14 0658 11/25/14 0505 11/27/14 0525 11/28/14 0504 11/29/14 0532  NA 133* 137 141 137 137  K 4.0 3.9 3.4* 4.0 3.9  CL 105 106 101 100 103  CO2 24 28 31 28 27   GLUCOSE 129* 137* 92 152* 141*  BUN <5* 7 8 10 15   CREATININE 0.66 0.58 0.69 0.62 0.71  CALCIUM 8.1* 8.3* 8.5 8.8 8.8    Liver Function Tests:  Recent Labs Lab 11/22/14 2125  AST 15  ALT 7  ALKPHOS 53  BILITOT 0.3  PROT 7.4  ALBUMIN 3.1*    Recent Labs Lab 11/22/14 2125  LIPASE 23   CBC:  Recent Labs Lab 11/22/14 2125  11/25/14 0505 11/26/14 0458 11/27/14 0525 11/28/14 0504 11/29/14 0532  WBC 6.7  < > 2.8* 4.9 4.8  4.2 7.6  NEUTROABS 4.4  --   --  2.0  --   --  6.7  HGB 11.5*  < > 10.2* 9.8* 10.7* 11.9* 11.7*  HCT 37.2  < > 32.3* 31.7* 34.8* 35.6* 37.3  MCV 85.1  < > 85.2 85.9 84.7 83.6 84.4  PLT 385  < > 311 334 331 353 396  < > = values in this interval not displayed.   Recent Results (from the past 240 hour(s))  Stool culture     Status: None (Preliminary result)   Collection Time: 11/23/14  2:51 PM  Result Value Ref Range Status   Specimen Description STOOL  Final   Special Requests NONE  Final   Culture   Final    NO SUSPICIOUS COLONIES, CONTINUING TO HOLD Performed at [facility]    Report  Status PENDING  Incomplete  Clostridium Difficile by PCR     Status: None   Collection Time: 11/23/14  2:51 PM  Result Value Ref Range Status   C difficile by pcr NEGATIVE NEGATIVE Final    Comment: Performed at [facility]     Studies: Ct Abdomen Pelvis W Contrast  11/23/2014   CLINICAL DATA:  Fever. Diarrhea with bright red blood in stool. Abdominal pain. History of ulcerative colitis.  EXAM: CT ABDOMEN AND PELVIS WITH CONTRAST  TECHNIQUE: Multidetector CT imaging of the abdomen and pelvis was performed using the standard protocol following bolus administration of intravenous contrast.  CONTRAST:  OMNIPAQUE IOHEXOL 300 MG/ML  SOLN  COMPARISON:  10/26/2014  FINDINGS: Dependent atelectasis in the lung bases. No effusions. Heart is normal size.  Low-density area noted adjacent to the falciform ligament in the liver compatible with focal fatty infiltration. Otherwise liver is unremarkable. Gallbladder, spleen, pancreas, adrenals and kidneys are unremarkable. Small nonobstructing stone in the mid to lower pole of the right kidney, stable. No ureteral stones or hydronephrosis.  There is diffuse colonic wall thickening, from the hepatic flexure to the rectosigmoid colon, most pronounced in the sigmoid colon and descending colon. These findings have progressed since prior study. Mild surrounding inflammatory stranding around the affected colon. Findings likely reflect changes related to patient's known ulcerative colitis. Stomach and small bowel are decompressed. No evidence of obstruction. Retrocecal appendix again noted and unchanged, upper limits normal in diameter.  Mildly prominent pericolonic lymph nodes are stable. Trace free fluid in the pelvis.  Uterus, adnexae and urinary bladder are unremarkable. Aorta is normal caliber.  IMPRESSION: Worsening wall thickening and inflammatory change in the colon from the hepatic flexure to the rectosigmoid colon, most pronounced in the descending colon  and sigmoid colon compatible with worsening ulcerative colitis. Stable prominent pericolonic mesenteric lymph nodes.   Electronically Signed   By: Charlett Nose M.D.   On: 11/23/2014 00:58    Scheduled Meds: . budesonide  9 mg Oral Daily  . escitalopram  20 mg Oral QHS  . ferrous sulfate  325 mg Oral Q breakfast  .  HYDROmorphone (DILAUDID) injection  1 mg Intravenous Once  . mercaptopurine  1.5 mg/kg Oral Daily  . Mesalamine  800 mg Oral TID  . methylPREDNISolone (SOLU-MEDROL) injection  60 mg Intravenous Q6H  . Norgestimate-Ethinyl Estradiol Triphasic  1 tablet Oral QHS  . pantoprazole  40 mg Oral QHS   Continuous Infusions:   Active Problems:   Ulcerative colitis   Acute blood loss anemia   Time spent: 25 minutes   [facility]  [facility] Pager [phone].  If 7PM-7AM, please contact night-coverage at [email or web address], password [facility] 11/29/2014, 9:22 AM  LOS: 7 days

## 2014-11-29 NOTE — Progress Notes (Signed)
[facility] Progress Note  Subjective: Received remicade 10 mg/kg yesterday. No change in sxs thus far but tolerating regular diet  Objective: Vital signs in last 24 hours: Temp:  [97.9 F (36.6 C)-98.9 F (37.2 C)] 98.1 F (36.7 C) (01/03 0610) Pulse Rate:  [50-71] 50 (01/03 0610) Resp:  [18] 18 (01/03 0610) BP: (104-130)/(62-86) 130/79 mmHg (01/03 0610) SpO2:  [95 %-98 %] 95 % (01/03 0610) Weight change:    [id number]  Lab Results: Results for orders placed or performed during the hospital encounter of 11/22/14 (from the past 24 hour(s))  CBC with Differential     Status: Abnormal   Collection Time: 11/29/14  5:32 AM  Result Value Ref Range   WBC 7.6 4.0 - 10.5 K/uL   RBC 4.42 3.87 - 5.11 MIL/uL   Hemoglobin 11.7 (L) 12.0 - 15.0 g/dL   HCT 98.1 19.1 - 47.8 %   MCV 84.4 78.0 - 100.0 fL   MCH 26.5 26.0 - 34.0 pg   MCHC 31.4 30.0 - 36.0 g/dL   RDW 29.5 62.1 - 30.8 %   Platelets 396 150 - 400 K/uL   Neutrophils Relative % 88 (H) 43 - 77 %   Neutro Abs 6.7 1.7 - 7.7 K/uL   Lymphocytes Relative 9 (L) 12 - 46 %   Lymphs Abs 0.7 0.7 - 4.0 K/uL   Monocytes Relative 3 3 - 12 %   Monocytes Absolute 0.2 0.1 - 1.0 K/uL   Eosinophils Relative 0 0 - 5 %   Eosinophils Absolute 0.0 0.0 - 0.7 K/uL   Basophils Relative 0 0 - 1 %   Basophils Absolute 0.0 0.0 - 0.1 K/uL  Basic metabolic panel     Status: Abnormal   Collection Time: 11/29/14  5:32 AM  Result Value Ref Range   Sodium 137 135 - 145 mmol/L   Potassium 3.9 3.5 - 5.1 mmol/L   Chloride 103 96 - 112 mEq/L   CO2 27 19 - 32 mmol/L   Glucose, Bld 141 (H) 70 - 99 mg/dL   BUN 15 6 - 23 mg/dL   Creatinine, Ser 6.57 0.50 - 1.10 mg/dL   Calcium 8.8 8.4 - 84.6 mg/dL   GFR calc non Af Amer >90 >90 mL/min   GFR calc Af Amer >90 >90 mL/min   Anion gap 7 5 - 15    Studies/Results: No results found.    Assessment: Steroid refractory ulcerative colitis  Plan: Cont solumedrol.Begin 6-MP.  If no response to  remicade in 2-3 days and unable to wean off IV steroids to discharge pt is leaning more and more toward surgery and may need transfer to tertiary care center to explore this vs cyclosporin, etc    Holt Woolbright C 11/29/2014, 8:46 AM

## 2014-11-30 LAB — COMPREHENSIVE METABOLIC PANEL
ALK PHOS: 41 U/L (ref 39–117)
ALT: 11 U/L (ref 0–35)
AST: 16 U/L (ref 0–37)
Albumin: 2.9 g/dL — ABNORMAL LOW (ref 3.5–5.2)
Anion gap: 11 (ref 5–15)
BUN: 14 mg/dL (ref 6–23)
CO2: 26 mmol/L (ref 19–32)
Calcium: 9 mg/dL (ref 8.4–10.5)
Chloride: 99 mEq/L (ref 96–112)
Creatinine, Ser: 0.63 mg/dL (ref 0.50–1.10)
GFR calc non Af Amer: >90 mL/min (ref >90)
GLUCOSE: 141 mg/dL — AB (ref 70–99)
POTASSIUM: 4.3 mmol/L (ref 3.5–5.1)
SODIUM: 136 mmol/L (ref 135–145)
TOTAL PROTEIN: 6.7 g/dL (ref 6.0–8.3)
Total Bilirubin: 0.6 mg/dL (ref 0.3–1.2)

## 2014-11-30 LAB — CBC WITH DIFFERENTIAL/PLATELET
Basophils Absolute: 0 10*3/uL (ref 0.0–0.1)
Basophils Relative: 0 % (ref 0–1)
Eosinophils Absolute: 0 10*3/uL (ref 0.0–0.7)
Eosinophils Relative: 0 % (ref 0–5)
HCT: 38.5 % (ref 36.0–46.0)
HEMOGLOBIN: 11.9 g/dL — AB (ref 12.0–15.0)
LYMPHS ABS: 0.7 10*3/uL (ref 0.7–4.0)
Lymphocytes Relative: 9 % — ABNORMAL LOW (ref 12–46)
MCH: 26.2 pg (ref 26.0–34.0)
MCHC: 30.9 g/dL (ref 30.0–36.0)
MCV: 84.6 fL (ref 78.0–100.0)
Monocytes Absolute: 0.2 10*3/uL (ref 0.1–1.0)
Monocytes Relative: 3 % (ref 3–12)
NEUTROS ABS: 6.9 10*3/uL (ref 1.7–7.7)
Neutrophils Relative %: 88 % — ABNORMAL HIGH (ref 43–77)
Platelets: 358 10*3/uL (ref 150–400)
RBC: 4.55 MIL/uL (ref 3.87–5.11)
RDW: 13.3 % (ref 11.5–15.5)
WBC: 7.9 10*3/uL (ref 4.0–10.5)

## 2014-11-30 NOTE — Progress Notes (Signed)
[facility] Progress Note  Subjective: Patient with known ulcerative colitis. She has had multiple flareups in the past with repeated admissions to the hospital and visits to the emergency room. She is currently on mesalamine, Remicade, 6-MP, and Solu-Medrol. She has noticed a decrease in the number of stools. She still has some complaints of abdominal pain.  Objective: Vital signs in last 24 hours: Temp:  [97.6 F (36.4 C)-98 F (36.7 C)] 97.6 F (36.4 C) (01/04 0515) Pulse Rate:  [45-62] 45 (01/04 0515) Resp:  [16-18] 16 (01/04 0515) BP: (124-129)/(76-86) 129/86 mmHg (01/04 0515) SpO2:  [97 %-99 %] 99 % (01/04 0515) Weight change:    PE:  She is in no distress. She is eating breakfast.  Heart regular rhythm no murmurs  Abdomen: Bowel sounds present, soft, minimal discomfort to palpation.  Lab Results: Results for orders placed or performed during the hospital encounter of 11/22/14 (from the past 24 hour(s))  Comprehensive metabolic panel     Status: Abnormal   Collection Time: 11/30/14  4:38 AM  Result Value Ref Range   Sodium 136 135 - 145 mmol/L   Potassium 4.3 3.5 - 5.1 mmol/L   Chloride 99 96 - 112 mEq/L   CO2 26 19 - 32 mmol/L   Glucose, Bld 141 (H) 70 - 99 mg/dL   BUN 14 6 - 23 mg/dL   Creatinine, Ser 4.09 0.50 - 1.10 mg/dL   Calcium 9.0 8.4 - 81.1 mg/dL   Total Protein 6.7 6.0 - 8.3 g/dL   Albumin 2.9 (L) 3.5 - 5.2 g/dL   AST 16 0 - 37 U/L   ALT 11 0 - 35 U/L   Alkaline Phosphatase 41 39 - 117 U/L   Total Bilirubin 0.6 0.3 - 1.2 mg/dL   GFR calc non Af Amer >90 >90 mL/min   GFR calc Af Amer >90 >90 mL/min   Anion gap 11 5 - 15  CBC with Differential     Status: Abnormal   Collection Time: 11/30/14  5:00 AM  Result Value Ref Range   WBC 7.9 4.0 - 10.5 K/uL   RBC 4.55 3.87 - 5.11 MIL/uL   Hemoglobin 11.9 (L) 12.0 - 15.0 g/dL   HCT 91.4 78.2 - 95.6 %   MCV 84.6 78.0 - 100.0 fL   MCH 26.2 26.0 - 34.0 pg   MCHC 30.9 30.0 - 36.0 g/dL   RDW 21.3  08.6 - 57.8 %   Platelets 358 150 - 400 K/uL   Neutrophils Relative % 88 (H) 43 - 77 %   Neutro Abs 6.9 1.7 - 7.7 K/uL   Lymphocytes Relative 9 (L) 12 - 46 %   Lymphs Abs 0.7 0.7 - 4.0 K/uL   Monocytes Relative 3 3 - 12 %   Monocytes Absolute 0.2 0.1 - 1.0 K/uL   Eosinophils Relative 0 0 - 5 %   Eosinophils Absolute 0.0 0.0 - 0.7 K/uL   Basophils Relative 0 0 - 1 %   Basophils Absolute 0.0 0.0 - 0.1 K/uL    Studies/Results: No results found.    Assessment: Ulcerative colitis.  Plan: Continue current medications. Hopefully we can get her through this flareup and get a referral to a tertiary referral center. She has expressed interest in colectomy and would like to discuss this further. She is tired of having repeated flareups.    Graylin Shiver 11/30/2014, 12:21 PM

## 2014-11-30 NOTE — Progress Notes (Signed)
[facility] PROGRESS NOTE  Kathleen Mcdonald:[id number] DOB: 08-Sep-1981 DOA: 11/22/2014 PCP: Irving Copas, MD  Problem List Active Problems:   Ulcerative colitis   Acute blood loss anemia  Brief HPI 34 year old Caucasian female with a known history of ulcerative colitis presented with fever up to 103F, abdominal pain, diarrhea and blood in stool. She's had 2 doses of Remicade as an outpatient for her ulcerative colitis. Asian is very slow to improve. She did not tolerate being placed on oral steroids. Intravenous steroids was reinitiated. She was given a dose of Remicade in the hospital. She was started on 6-MP.  Assessment/Plan:  Ulcerative colitis flare refractory to steroids. She was placed back on intravenous Solu-Medrol by GI. She was given a dose of Remicade 1/2. 6 MP initiated 1/3. She had only 4 bowel movements yesterday. Minimal bleeding. Abdominal cramping continues. She appears to be improving clinically. Discussed with Dr. Mikey College. There is no indication for urgent transfer to a tertiary center. The hope is that in the next few days she will improve and be able to go home. And referral to tertiary center can be arranged as an outpatient.  Anemia secondary to acute blood loss. Hemoglobin low but stable. No need for transfusion at this time. Continue to monitor.  Anxiety Continue Valium, Lexapro  DVT prophylaxis: SCDs Code Status: full Family Communication: Discussed with the patient. Disposition Plan:  Await improvement in diarrhea and bleeding.   Consultants:  Gastroenterology  Procedures:  None  Antibiotics: None  Subjective: She had 4 bowel movements in the last 24 hours. Bleeding is reduced. Continues to have abdominal discomfort and cramping. Able to tolerate orally. No nausea or vomiting. Ambulating in the hallway.  Objective: Filed Vitals:   11/29/14 0610 11/29/14 1400 11/29/14 2145 11/30/14 0515  BP: 130/79 124/76 128/84 129/86   Pulse: 50 62 51 45  Temp: 98.1 F (36.7 C) 98 F (36.7 C) 97.8 F (36.6 C) 97.6 F (36.4 C)  TempSrc: Oral Oral Oral Oral  Resp: Height:      Weight:      SpO2: 95% 98% 97% 99%    Intake/Output Summary (Last 24 hours) at 11/30/14 0955 Last data filed at 11/30/14 2952  Gross per 24 hour  Intake    480 ml  Output   2800 ml  Net  -2320 ml    Exam:  General: alert & oriented x 3 In NAD  Cardiovascular: RRR, nl S1 s2  Respiratory: Decreased breath sounds at the bases. Clear to auscultation. Abdomen: Abdomen remains soft. Minimal vague tenderness diffusely. Without any rebound, rigidity or guarding. Bowel sounds are present. No masses or organomegaly. Extremities: No cyanosis and no edema   Data Reviewed: Basic Metabolic Panel:  Recent Labs Lab 11/25/14 0505 11/27/14 0525 11/28/14 0504 11/29/14 0532 11/30/14 0438  NA 137 141 137 137 136  K 3.9 3.4* 4.0 3.9 4.3  CL 106 101 100 103 99  CO2 GLUCOSE 137* 92 152* 141* 141*  BUN CREATININE 0.58 0.69 0.62 0.71 0.63  CALCIUM 8.3* 8.5 8.8 8.8 9.0    Liver Function Tests:  Recent Labs Lab 11/30/14 0438  AST 16  ALT 11  ALKPHOS 41  BILITOT 0.6  PROT 6.7  ALBUMIN 2.9*   CBC:  Recent Labs Lab 11/26/14 0458 11/27/14 0525 11/28/14 0504 11/29/14 0532 11/30/14 0500  WBC 4.9 4.8 4.2 7.6 7.9  NEUTROABS 2.0  --   --  6.7 6.9  HGB 9.8* 10.7* 11.9* 11.7* 11.9*  HCT 31.7* 34.8* 35.6* 37.3 38.5  MCV 85.9 84.7 83.6 84.4 84.6  PLT 334 331 353 396 358     Recent Results (from the past 240 hour(s))  Stool culture     Status: None   Collection Time: 11/23/14  2:51 PM  Result Value Ref Range Status   Specimen Description STOOL  Final   Special Requests NONE  Final   Culture   Final    NO SALMONELLA, SHIGELLA, CAMPYLOBACTER, YERSINIA, OR E.COLI 0157:H7 ISOLATED Performed at [facility]    Report Status 11/29/2014 FINAL  Final  Clostridium Difficile by PCR      Status: None   Collection Time: 11/23/14  2:51 PM  Result Value Ref Range Status   C difficile by pcr NEGATIVE NEGATIVE Final    Comment: Performed at [facility]     Studies: Ct Abdomen Pelvis W Contrast  11/23/2014   CLINICAL DATA:  Fever. Diarrhea with bright red blood in stool. Abdominal pain. History of ulcerative colitis.  EXAM: CT ABDOMEN AND PELVIS WITH CONTRAST  TECHNIQUE: Multidetector CT imaging of the abdomen and pelvis was performed using the standard protocol following bolus administration of intravenous contrast.  CONTRAST:  OMNIPAQUE IOHEXOL 300 MG/ML  SOLN  COMPARISON:  10/26/2014  FINDINGS: Dependent atelectasis in the lung bases. No effusions. Heart is normal size.  Low-density area noted adjacent to the falciform ligament in the liver compatible with focal fatty infiltration. Otherwise liver is unremarkable. Gallbladder, spleen, pancreas, adrenals and kidneys are unremarkable. Small nonobstructing stone in the mid to lower pole of the right kidney, stable. No ureteral stones or hydronephrosis.  There is diffuse colonic wall thickening, from the hepatic flexure to the rectosigmoid colon, most pronounced in the sigmoid colon and descending colon. These findings have progressed since prior study. Mild surrounding inflammatory stranding around the affected colon. Findings likely reflect changes related to patient's known ulcerative colitis. Stomach and small bowel are decompressed. No evidence of obstruction. Retrocecal appendix again noted and unchanged, upper limits normal in diameter.  Mildly prominent pericolonic lymph nodes are stable. Trace free fluid in the pelvis.  Uterus, adnexae and urinary bladder are unremarkable. Aorta is normal caliber.  IMPRESSION: Worsening wall thickening and inflammatory change in the colon from the hepatic flexure to the rectosigmoid colon, most pronounced in the descending colon and sigmoid colon compatible with worsening ulcerative  colitis. Stable prominent pericolonic mesenteric lymph nodes.   Electronically Signed   By: Charlett Nose M.D.   On: 11/23/2014 00:58    Scheduled Meds: . budesonide  9 mg Oral Daily  . escitalopram  20 mg Oral QHS  . ferrous sulfate  325 mg Oral Q breakfast  .  HYDROmorphone (DILAUDID) injection  1 mg Intravenous Once  . mercaptopurine  1.5 mg/kg Oral Daily  . Mesalamine  800 mg Oral TID  . methylPREDNISolone (SOLU-MEDROL) injection  60 mg Intravenous Q6H  . Norgestimate-Ethinyl Estradiol Triphasic  1 tablet Oral QHS  . pantoprazole  40 mg Oral QHS   Continuous Infusions:   Active Problems:   Ulcerative colitis   Acute blood loss anemia   Time spent: 25 minutes   [facility]  [facility] Pager [phone].   If 7PM-7AM, please contact night-coverage at [email or web address], password [facility] 11/30/2014, 9:55 AM  LOS: 8 days

## 2014-12-01 DIAGNOSIS — D62 Acute posthemorrhagic anemia: Secondary | ICD-10-CM

## 2014-12-01 MED ORDER — HYOSCYAMINE SULFATE 0.125 MG PO TABS: 0.1250 mg | ORAL_TABLET | Freq: Three times a day (TID) | ORAL | Status: DC

## 2014-12-01 MED ORDER — HYOSCYAMINE SULFATE 0.125 MG SL SUBL
0.1250 mg | SUBLINGUAL_TABLET | Freq: Three times a day (TID) | SUBLINGUAL | Status: DC
  Administered 2014-12-01 – 2014-12-03 (×8): 0.125 mg via SUBLINGUAL
  Filled 2014-12-01 (×9): qty 1

## 2014-12-01 NOTE — Progress Notes (Signed)
[facility] Progress Note  Subjective: The patient has no major complaints today. She has not had a bowel movement overnight or today yet. She does have some abdominal discomfort.  Objective: Vital signs in last 24 hours: Temp:  [98.1 F (36.7 C)-98.4 F (36.9 C)] 98.1 F (36.7 C) (01/05 0546) Pulse Rate:  [55-58] 55 (01/05 0546) Resp:  [16] 16 (01/05 0546) BP: (114-139)/(68-82) 114/76 mmHg (01/05 0546) SpO2:  [97 %-100 %] 97 % (01/05 0546) Weight change:    PE:  She is in no distress  Heart regular rhythm no murmurs  Lungs clear  Abdomen: All sounds present, soft, nontender    Lab Results: No results found for this or any previous visit (from the past 24 hour(s)).  Studies/Results: No results found.    Assessment:  ulcerative colitis. She currently appears stable.  Plan: Continue current medications. I think I would continue the IV steroids one more day and then switch her over to oral prednisone tomorrow. She is continuing on Remicade and 6-MP. Hopefully we can continue to improve her symptoms and get her discharged for outpatient follow-up with Dr. Ewing SchleinMagod, and then [facility].    Loki Wuthrich F 12/01/2014, 9:02 AM

## 2014-12-01 NOTE — Progress Notes (Signed)
[facility] PROGRESS NOTE  Kathleen Mcdonald ZOX:[id number] DOB: 1981/09/07 DOA: 11/22/2014 PCP: Irving Copas, MD  Problem List Active Problems:   Ulcerative colitis   Acute blood loss anemia  Brief HPI 34 year old Caucasian female with a known history of ulcerative colitis presented with fever up to 103F, abdominal pain, diarrhea and blood in stool. She's had 2 doses of Remicade as an outpatient for her ulcerative colitis. Asian is very slow to improve. She did not tolerate being placed on oral steroids. Intravenous steroids was reinitiated. She was given a dose of Remicade in the hospital. She was started on 6-MP. She is slowly improving.  Assessment/Plan:  Ulcerative colitis flare refractory to steroids. She was placed back on intravenous Solu-Medrol by GI. She was given a dose of Remicade 1/2. 6 MP initiated 1/3. Frequency of diarrhea appears to have improved. Bleeding appears to be subsiding. Abdominal cramping continues. Discussed with Dr. Evette Cristal and will initiate Levsin. She appears to be improving clinically. Plan is for steroids to be changed over to oral tomorrow. There is no indication for urgent transfer to a tertiary center. The hope is that in the next few days she will improve and be able to go home. And referral to tertiary center can be arranged as an outpatient.  Anemia secondary to acute blood loss. Hemoglobin low but stable. No need for transfusion at this time. Continue to monitor.  Anxiety Continue Valium, Lexapro  DVT prophylaxis: SCDs Code Status: full Family Communication: Discussed with the patient. Disposition Plan:  Await improvement in diarrhea and bleeding. Hopefully home by Thursday or Friday.   Consultants:  Gastroenterology  Procedures:  None  Antibiotics: None  Subjective: She had only 2 bowel movements in the last 24 hours. Bleeding is reduced. Continues to have abdominal discomfort and cramping. Able to tolerate orally. No  nausea or vomiting.   Objective: Filed Vitals:   11/30/14 0515 11/30/14 1329 11/30/14 2220 12/01/14 0546  BP: 129/86 119/68 139/82 114/76  Pulse: 45 56 58 55  Temp: 97.6 F (36.4 C) 98.4 F (36.9 C) 98.1 F (36.7 C) 98.1 F (36.7 C)  TempSrc: Oral Oral Oral Oral  Resp: Height:      Weight:      SpO2: 99% 99% 100% 97%    Intake/Output Summary (Last 24 hours) at 12/01/14 0919 Last data filed at 12/01/14 0547  Gross per 24 hour  Intake   1320 ml  Output   1850 ml  Net   -530 ml    Exam:  General: alert & oriented x 3 In NAD  Cardiovascular: RRR, nl S1 s2  Respiratory: Decreased breath sounds at the bases. Clear to auscultation. Abdomen: Abdomen remains soft. Minimal vague tenderness diffusely. Without any rebound, rigidity or guarding. Bowel sounds are present. No masses or organomegaly. Extremities: No cyanosis and no edema   Data Reviewed: Basic Metabolic Panel:  Recent Labs Lab 11/25/14 0505 11/27/14 0525 11/28/14 0504 11/29/14 0532 11/30/14 0438  NA 137 141 137 137 136  K 3.9 3.4* 4.0 3.9 4.3  CL 106 101 100 103 99  CO2 GLUCOSE 137* 92 152* 141* 141*  BUN CREATININE 0.58 0.69 0.62 0.71 0.63  CALCIUM 8.3* 8.5 8.8 8.8 9.0    Liver Function Tests:  Recent Labs Lab 11/30/14 0438  AST 16  ALT 11  ALKPHOS 41  BILITOT 0.6  PROT 6.7  ALBUMIN 2.9*   CBC:  Recent Labs Lab 11/26/14 0458 11/27/14 0525 11/28/14 0504 11/29/14 0532 11/30/14 0500  WBC 4.9 4.8 4.2 7.6 7.9  NEUTROABS 2.0  --   --  6.7 6.9  HGB 9.8* 10.7* 11.9* 11.7* 11.9*  HCT 31.7* 34.8* 35.6* 37.3 38.5  MCV 85.9 84.7 83.6 84.4 84.6  PLT 334 331 353 396 358     Recent Results (from the past 240 hour(s))  Stool culture     Status: None   Collection Time: 11/23/14  2:51 PM  Result Value Ref Range Status   Specimen Description STOOL  Final   Special Requests NONE  Final   Culture   Final    NO SALMONELLA, SHIGELLA, CAMPYLOBACTER,  YERSINIA, OR E.COLI 0157:H7 ISOLATED Performed at [facility]    Report Status 11/29/2014 FINAL  Final  Clostridium Difficile by PCR     Status: None   Collection Time: 11/23/14  2:51 PM  Result Value Ref Range Status   C difficile by pcr NEGATIVE NEGATIVE Final    Comment: Performed at [facility]     Studies: Ct Abdomen Pelvis W Contrast  11/23/2014   CLINICAL DATA:  Fever. Diarrhea with bright red blood in stool. Abdominal pain. History of ulcerative colitis.  EXAM: CT ABDOMEN AND PELVIS WITH CONTRAST  TECHNIQUE: Multidetector CT imaging of the abdomen and pelvis was performed using the standard protocol following bolus administration of intravenous contrast.  CONTRAST:  100mL OMNIPAQUE IOHEXOL 300 MG/ML  SOLN  COMPARISON:  10/26/2014  FINDINGS: Dependent atelectasis in the lung bases. No effusions. Heart is normal size.  Low-density area noted adjacent to the falciform ligament in the liver compatible with focal fatty infiltration. Otherwise liver is unremarkable. Gallbladder, spleen, pancreas, adrenals and kidneys are unremarkable. Small nonobstructing stone in the mid to lower pole of the right kidney, stable. No ureteral stones or hydronephrosis.  There is diffuse colonic wall thickening, from the hepatic flexure to the rectosigmoid colon, most pronounced in the sigmoid colon and descending colon. These findings have progressed since prior study. Mild surrounding inflammatory stranding around the affected colon. Findings likely reflect changes related to patient's known ulcerative colitis. Stomach and small bowel are decompressed. No evidence of obstruction. Retrocecal appendix again noted and unchanged, upper limits normal in diameter.  Mildly prominent pericolonic lymph nodes are stable. Trace free fluid in the pelvis.  Uterus, adnexae and urinary bladder are unremarkable. Aorta is normal caliber.  IMPRESSION: Worsening wall thickening and inflammatory change in the colon from  the hepatic flexure to the rectosigmoid colon, most pronounced in the descending colon and sigmoid colon compatible with worsening ulcerative colitis. Stable prominent pericolonic mesenteric lymph nodes.   Electronically Signed   By: Charlett NoseKevin  Dover M.D.   On: 11/23/2014 00:58    Scheduled Meds: . budesonide  9 mg Oral Daily  . escitalopram  20 mg Oral QHS  . ferrous sulfate  325 mg Oral Q breakfast  .  HYDROmorphone (DILAUDID) injection  1 mg Intravenous Once  . mercaptopurine  1.5 mg/kg Oral Daily  . Mesalamine  800 mg Oral TID  . methylPREDNISolone (SOLU-MEDROL) injection  60 mg Intravenous Q6H  . Norgestimate-Ethinyl Estradiol Triphasic  1 tablet Oral QHS  . pantoprazole  40 mg Oral QHS   Continuous Infusions:   Active Problems:   Ulcerative colitis   Acute blood loss anemia   Time spent: 25 minutes   [facility] CenterKRISHNAN,Caoimhe Damron  [facility] Pager [phone].   If 7PM-7AM, please contact night-coverage at [email or web address], password [email or web address]  12/01/2014, 9:19 AM  LOS: 9 days

## 2014-12-02 LAB — BASIC METABOLIC PANEL
ANION GAP: 9 (ref 5–15)
BUN: 18 mg/dL (ref 6–23)
CO2: 27 mmol/L (ref 19–32)
Calcium: 8.6 mg/dL (ref 8.4–10.5)
Chloride: 102 mEq/L (ref 96–112)
Creatinine, Ser: 0.78 mg/dL (ref 0.50–1.10)
GFR calc Af Amer: >90 mL/min (ref >90)
GFR calc non Af Amer: >90 mL/min (ref >90)
GLUCOSE: 171 mg/dL — AB (ref 70–99)
Potassium: 3.8 mmol/L (ref 3.5–5.1)
SODIUM: 138 mmol/L (ref 135–145)

## 2014-12-02 LAB — CBC
HEMATOCRIT: 37.5 % (ref 36.0–46.0)
Hemoglobin: 11.5 g/dL — ABNORMAL LOW (ref 12.0–15.0)
MCH: 26.3 pg (ref 26.0–34.0)
MCHC: 30.7 g/dL (ref 30.0–36.0)
MCV: 85.8 fL (ref 78.0–100.0)
Platelets: 338 10*3/uL (ref 150–400)
RBC: 4.37 MIL/uL (ref 3.87–5.11)
RDW: 13 % (ref 11.5–15.5)
WBC: 8.8 10*3/uL (ref 4.0–10.5)

## 2014-12-02 NOTE — Progress Notes (Signed)
[facility] PROGRESS NOTE  Interval history: 34 year old Caucasian female with a known history of ulcerative colitis presented with fever up to 103F, abdominal pain, diarrhea and blood in stool. She's had 2 doses of Remicade as an outpatient for her ulcerative colitis. Asian is very slow to improve. She did not tolerate being placed on oral steroids. Intravenous steroids was reinitiated. She was given a dose of Remicade in the hospital. She was started on 6-MP. She is slowly improving.  Assessment/Plan: Ulcerative colitis flare refractory to steroids. - Cont IV Solu-Medrol by GI.  - She was given a dose of Remicade 1/2. 6 MP initiated 1/3. - Frequency of diarrhea appears to have improved.  Anemia secondary to acute blood loss. Hemoglobin low but stable. No need for transfusion at this time. Continue to monitor.  Anxiety Continue Valium, Lexapro  DVT prophylaxis: SCDs Code Status: full Family Communication: Discussed with the patient. Disposition Plan:  Await improvement in diarrhea and bleeding. Hopefully home by Thursday or Friday.   Consultants:  Gastroenterology  Procedures:  None  Antibiotics: None  Subjective: She had only 2 bowel movements in the last 24 hours. Bleeding is reduced. Continues to have abdominal discomfort and cramping. Able to tolerate orally. No nausea or vomiting.   Objective: Filed Vitals:   11/30/14 2220 12/01/14 0546 12/01/14 2140 12/02/14 0605  BP: 139/82 114/76 122/66 118/76  Pulse: 58 55 64 58  Temp: 98.1 F (36.7 C) 98.1 F (36.7 C) 98.3 F (36.8 C) 98.1 F (36.7 C)  TempSrc: Oral Oral Oral Oral  Resp: Height:      Weight:      SpO2: 100% 97% 96% 94%    Intake/Output Summary (Last 24 hours) at 12/02/14 1219 Last data filed at 12/02/14 1000  Gross per 24 hour  Intake   1200 ml  Output   1300 ml  Net   -100 ml    Exam:  General: alert & oriented x 3 In NAD  Cardiovascular: RRR, nl S1 s2  Abdomen:  Abdomen remains soft. Minimal vague tenderness diffusely. Without any rebound, rigidity or guarding. Bowel sounds are present.  Extremities: No cyanosis and no edema   Data Reviewed: Basic Metabolic Panel:  Recent Labs Lab 11/27/14 0525 11/28/14 0504 11/29/14 0532 11/30/14 0438 12/02/14 0504  NA 141 137 137 136 138  K 3.4* 4.0 3.9 4.3 3.8  CL 101 100 103 99 102  CO2 GLUCOSE 92 152* 141* 141* 171*  BUN CREATININE 0.69 0.62 0.71 0.63 0.78  CALCIUM 8.5 8.8 8.8 9.0 8.6    Liver Function Tests:  Recent Labs Lab 11/30/14 0438  AST 16  ALT 11  ALKPHOS 41  BILITOT 0.6  PROT 6.7  ALBUMIN 2.9*   CBC:  Recent Labs Lab 11/26/14 0458 11/27/14 0525 11/28/14 0504 11/29/14 0532 11/30/14 0500 12/02/14 0504  WBC 4.9 4.8 4.2 7.6 7.9 8.8  NEUTROABS 2.0  --   --  6.7 6.9  --   HGB 9.8* 10.7* 11.9* 11.7* 11.9* 11.5*  HCT 31.7* 34.8* 35.6* 37.3 38.5 37.5  MCV 85.9 84.7 83.6 84.4 84.6 85.8  PLT 334 331 353 396 358 338     Recent Results (from the past 240 hour(s))  Stool culture     Status: None   Collection Time: 11/23/14  2:51 PM  Result Value Ref Range Status   Specimen Description STOOL  Final   Special Requests  NONE  Final   Culture   Final    NO SALMONELLA, SHIGELLA, CAMPYLOBACTER, YERSINIA, OR E.COLI 0157:H7 ISOLATED Performed at [facility]    Report Status 11/29/2014 FINAL  Final  Clostridium Difficile by PCR     Status: None   Collection Time: 11/23/14  2:51 PM  Result Value Ref Range Status   C difficile by pcr NEGATIVE NEGATIVE Final    Comment: Performed at [facility]     Studies: Ct Abdomen Pelvis W Contrast  11/23/2014   CLINICAL DATA:  Fever. Diarrhea with bright red blood in stool. Abdominal pain. History of ulcerative colitis.  EXAM: CT ABDOMEN AND PELVIS WITH CONTRAST  TECHNIQUE: Multidetector CT imaging of the abdomen and pelvis was performed using the standard protocol following bolus  administration of intravenous contrast.  CONTRAST:  100mL OMNIPAQUE IOHEXOL 300 MG/ML  SOLN  COMPARISON:  10/26/2014  FINDINGS: Dependent atelectasis in the lung bases. No effusions. Heart is normal size.  Low-density area noted adjacent to the falciform ligament in the liver compatible with focal fatty infiltration. Otherwise liver is unremarkable. Gallbladder, spleen, pancreas, adrenals and kidneys are unremarkable. Small nonobstructing stone in the mid to lower pole of the right kidney, stable. No ureteral stones or hydronephrosis.  There is diffuse colonic wall thickening, from the hepatic flexure to the rectosigmoid colon, most pronounced in the sigmoid colon and descending colon. These findings have progressed since prior study. Mild surrounding inflammatory stranding around the affected colon. Findings likely reflect changes related to patient's known ulcerative colitis. Stomach and small bowel are decompressed. No evidence of obstruction. Retrocecal appendix again noted and unchanged, upper limits normal in diameter.  Mildly prominent pericolonic lymph nodes are stable. Trace free fluid in the pelvis.  Uterus, adnexae and urinary bladder are unremarkable. Aorta is normal caliber.  IMPRESSION: Worsening wall thickening and inflammatory change in the colon from the hepatic flexure to the rectosigmoid colon, most pronounced in the descending colon and sigmoid colon compatible with worsening ulcerative colitis. Stable prominent pericolonic mesenteric lymph nodes.   Electronically Signed   By: Charlett NoseKevin  Dover M.D.   On: 11/23/2014 00:58    Scheduled Meds: . budesonide  9 mg Oral Daily  . escitalopram  20 mg Oral QHS  . ferrous sulfate  325 mg Oral Q breakfast  .  HYDROmorphone (DILAUDID) injection  1 mg Intravenous Once  . hyoscyamine  0.125 mg Sublingual TID  . mercaptopurine  1.5 mg/kg Oral Daily  . Mesalamine  800 mg Oral TID  . methylPREDNISolone (SOLU-MEDROL) injection  60 mg Intravenous Q6H  .  Norgestimate-Ethinyl Estradiol Triphasic  1 tablet Oral QHS  . pantoprazole  40 mg Oral QHS   Continuous Infusions:   Active Problems:   Ulcerative colitis   Acute blood loss anemia   Time spent: 25 minutes   Marinda ElkFELIZ ORTIZ, ABRAHAM  [facility] Pager [phone].   If 7PM-7AM, please contact night-coverage at [email or web address], password [email or web address] 12/02/2014, 12:19 PM  LOS: 10 days

## 2014-12-02 NOTE — Progress Notes (Signed)
[facility] Progress Note  Subjective: The patient states that she started to experience a little more rectal bleeding today with bowel movements. And increasing frequency of stools. She does have some abdominal cramping as well.  Objective: Vital signs in last 24 hours: Temp:  [98.1 F (36.7 C)-98.3 F (36.8 C)] 98.1 F (36.7 C) (01/06 0605) Pulse Rate:  [58-64] 58 (01/06 0605) Resp:  [16] 16 (01/06 0605) BP: (118-122)/(66-76) 118/76 mmHg (01/06 0605) SpO2:  [94 %-96 %] 94 % (01/06 0605) Weight change:    PE:  She does not appear in any acute distress  Heart regular rhythm  Lungs clear  Abdomen soft, bowel sounds present, mild diffuse discomfort to palpation but no guarding or rebound.  Lab Results: Results for orders placed or performed during the hospital encounter of 11/22/14 (from the past 24 hour(s))  CBC     Status: Abnormal   Collection Time: 12/02/14  5:04 AM  Result Value Ref Range   WBC 8.8 4.0 - 10.5 K/uL   RBC 4.37 3.87 - 5.11 MIL/uL   Hemoglobin 11.5 (L) 12.0 - 15.0 g/dL   HCT 16.137.5 09.636.0 - 04.546.0 %   MCV 85.8 78.0 - 100.0 fL   MCH 26.3 26.0 - 34.0 pg   MCHC 30.7 30.0 - 36.0 g/dL   RDW 40.913.0 81.111.5 - 91.415.5 %   Platelets 338 150 - 400 K/uL  Basic metabolic panel     Status: Abnormal   Collection Time: 12/02/14  5:04 AM  Result Value Ref Range   Sodium 138 135 - 145 mmol/L   Potassium 3.8 3.5 - 5.1 mmol/L   Chloride 102 96 - 112 mEq/L   CO2 27 19 - 32 mmol/L   Glucose, Bld 171 (H) 70 - 99 mg/dL   BUN 18 6 - 23 mg/dL   Creatinine, Ser 7.820.78 0.50 - 1.10 mg/dL   Calcium 8.6 8.4 - 95.610.5 mg/dL   GFR calc non Af Amer >90 >90 mL/min   GFR calc Af Amer >90 >90 mL/min   Anion gap 9 5 - 15    Studies/Results: No results found.    Assessment: Ulcerative colitis.  It appears that there is still activity of her ulcerative colitis.  Plan: In view of continued activity of ulcerative colitis I would continue with the IV Solu-Medrol in addition to the  other medications which she is on. We had talked about switching to oral prednisone yesterday but since it appears that there is still activity going on despite the IV Solu-Medrol I think we need to continue this for a bit longer. She has just recently started the combination of Remicade and 6-MP. It has not been long enough to tell if this is going to be effective or not.    Graylin ShiverGANEM,[place]ed[place] Jenifer F 12/02/2014, 9:33 AM  Lab Results  Component Value Date   HGB 11.5* 12/02/2014   HGB 11.9* 11/30/2014   HGB 11.7* 11/29/2014   HCT 37.5 12/02/2014   HCT 38.5 11/30/2014   HCT 37.3 11/29/2014   ALKPHOS 41 11/30/2014   ALKPHOS 53 11/22/2014   ALKPHOS 79 10/25/2014   AST 16 11/30/2014   AST 15 11/22/2014   AST 8 10/25/2014   ALT 11 11/30/2014   ALT 7 11/22/2014   ALT <5 10/25/2014   AMYLASE 80 12/21/2013   AMYLASE 292* 12/20/2013

## 2014-12-03 DIAGNOSIS — K5190 Ulcerative colitis, unspecified, without complications: Principal | ICD-10-CM

## 2014-12-03 MED ORDER — MERCAPTOPURINE 50 MG PO TABS: 1.5000 mg/kg | ORAL_TABLET | Freq: Every day | ORAL | Status: DC

## 2014-12-03 MED ORDER — MESALAMINE 400 MG PO CPDR: 800.0000 mg | DELAYED_RELEASE_CAPSULE | Freq: Three times a day (TID) | ORAL | Status: DC

## 2014-12-03 MED ORDER — METHYLPREDNISOLONE SODIUM SUCC 125 MG IJ SOLR: 60.0000 mg | Freq: Four times a day (QID) | INTRAMUSCULAR | Status: DC

## 2014-12-03 NOTE — Progress Notes (Signed)
Report given to Madelynn Doneavid Gay,RN at [facility]. Patient to be transferred via [facility]

## 2014-12-03 NOTE — Progress Notes (Signed)
[facility] Progress Note  Subjective: The patient still complains of abdominal cramping. She has not had a bowel movement for the past 12 hours. Her stools yesterday however did have blood in them. She remains on Solu-Medrol. She has received Remicade and 6-MP. She states that she has been through multiple flareups of ulcerative colitis and is getting tired of having the disease. She requests transfer to [facility] to see if anything more can be done. She states she is also leaning towards total colectomy to cure her ulcerative colitis. She would like a [facility] opinion on this.  Objective: Vital signs in last 24 hours: Temp:  [97.8 Mcdonald (36.6 C)-98.8 Mcdonald (37.1 C)] 98.1 Mcdonald (36.7 C) (01/07 0518) Pulse Rate:  [49-58] 49 (01/07 0518) Resp:  [17-18] 18 (01/07 0518) BP: (129-135)/(70-77) 135/71 mmHg (01/07 0518) SpO2:  [95 %-97 %] 97 % (01/07 0518) Weight change:    PE: She is in no distress  Heart regular rhythm no murmurs  Lungs clear  Abdomen: Bowel sounds are normal, soft, mild tenderness diffusely  Lab Results: No results found for this or any previous visit (from the past 24 hour(s)).  Studies/Results: No results found.    Assessment: Ulcerative colitis  Plan: Despite being on Remicade, and now 6-MP, and Solu-Medrol she continues to be symptomatic with abdominal cramping, diarrhea, and rectal bleeding. She states that she and her husband have talked about this and they would like for [facility] to be transferred [facility] to see if anything more can be done. She is also stating that she is so tired of having flareups that she is strongly considering colectomy to cure the disease. Given her wishes I would therefore recommend seeing if she can be transferred to [facility].  Kathleen Mcdonald,Kathleen Mcdonald 12/03/2014, 9:25 AM

## 2014-12-03 NOTE — Progress Notes (Signed)
Spoke with Lonnie with [facility], report given on patient to transfer. C.Dianelys Scinto,RN

## 2014-12-03 NOTE — Progress Notes (Signed)
Patient picked up by [facility] transported to [facility]. Patient stable at time of transfer,vitals charted on d/c in EPIC. C.Kathaleen Dudziak,RN

## 2014-12-03 NOTE — Progress Notes (Signed)
I discussed Ms. Kathleen Mcdonald's case with Dr. Achilles DunkJohn Gilliam from [facility] and he will except in transfer and she will need a discharge dictation by the hospitalist please let me know if I can be of any further assistance

## 2014-12-03 NOTE — Discharge Summary (Signed)
Physician Discharge Summary  Kathleen Mcdonald WJX:[id number] DOB: 10/25/1981 DOA: 11/22/2014  PCP: Irving Copas, MD  Admit date: 11/22/2014 Discharge date: 12/03/2014  Time spent: 35 minutes  Recommendations for Outpatient Follow-up:  1. Patient will be transferred weight for [facility]  Discharge Diagnoses:  Active Problems:   Ulcerative colitis   Acute blood loss anemia   Discharge Condition: stable  Diet recommendation: regular  Filed Weights   11/22/14 2102  Weight: 76.658 kg (169 lb)    History of present illness:  34 y.o. female h/o UC, currently attempting to control with Remicaid injected earlier this month. Patient presents to ED with worsening abdominal pain. Pain started 1 week ago, worsening nausea, diarrhea, blood in stool, feels like prior UC flare. Running temperature at home up to 103 (101.1 in ED). In addition to remicaid she is being treated with budesonide  daily. GI doc is Dr. Vida Rigger. Nothing makes symptoms better or worse.  Hospital Course:  Ulcerative colitis flare refractory to steroids: - Gi was consulted. - She was checked for C. difficile which was negative. Stool cultures negative. - She was started on high-dose IV Solu-Medrol  and 6-mercaptopurine, continue on budesonide. - She received Remicade as an outpatient 4 weeks prior to admission. - Despite being on IV Solu-Medrol and the previous mentioned medication she continued to have 6-7 bloody bowel movements a day and abdominal cramping. She discussed with GI and as she has not an off steroids for over a year and due to her refractory condition she is considering a total colectomy. - It was discussed with GI and they recommended transfer to a tertiary center.  Anemia secondary to acute blood loss. - Hemoglobin low but stable. No need for transfusion at this time. Continue to monitor.  Anxiety Continue Valium,  Lexapro  Procedures:  none  Consultations:  Enid Baas ( 1ry gastroenterologist id Dr. Ewing Schlein.)  Discharge Exam: Filed Vitals:   12/03/14 0518  BP: 135/71  Pulse: 49  Temp: 98.1 F (36.7 C)  Resp: 18    General: A&O x3 Cardiovascular: RRR Respiratory: good air movement CTA B/L  Discharge Instructions   Discharge Instructions    Diet - low sodium heart healthy    Complete by:  As directed      Increase activity slowly    Complete by:  As directed           Current Discharge Medication List    START taking these medications   Details  mercaptopurine (PURINETHOL) 50 MG tablet Take 2 tablets (100 mg total) by mouth daily. Give on an empty stomach 1 hour before or 2 hours after meals. Caution: Chemotherapy.    Mesalamine (ASACOL) 400 MG CPDR DR capsule Take 2 capsules (800 mg total) by mouth 3 (three) times daily. Qty: 180 capsule    methylPREDNISolone sodium succinate (SOLU-MEDROL) 125 mg/2 mL injection Inject 0.96 mLs (60 mg total) into the vein every 6 (six) hours. Qty: 1 each, Refills: 0      CONTINUE these medications which have NOT CHANGED   Details  budesonide (ENTOCORT EC) 3 MG 24 hr capsule Take 3 mg by mouth daily.    clidinium-chlordiazePOXIDE (LIBRAX) 5-2.5 MG per capsule Take 1 capsule by mouth every 6 (six) hours as needed.    diazepam (VALIUM) 5 MG tablet Take 1 tablet (5 mg total) by mouth every 12 (twelve) hours as needed for muscle spasms. Qty: 10 tablet, Refills: 0    escitalopram (LEXAPRO) 20 MG  tablet Take 20 mg by mouth at bedtime.     HYDROcodone-acetaminophen (NORCO/VICODIN) 5-325 MG per tablet Take 1-2 tablets by mouth every 6 (six) hours as needed for moderate pain or severe pain. Qty: 11 tablet, Refills: 0    Norgestimate-Ethinyl Estradiol Triphasic (TRINESSA, 28,) 0.18/0.215/0.25 MG-35 MCG tablet Take 1 tablet by mouth at bedtime.    pantoprazole (PROTONIX) 40 MG tablet Take 40 mg by mouth at bedtime.     vitamin C (ASCORBIC ACID)  500 MG tablet Take 500 mg by mouth every morning.    cetirizine (ZYRTEC) 10 MG tablet Take 10 mg by mouth daily as needed for allergies.     ferrous sulfate 325 (65 FE) MG tablet Take 325 mg by mouth daily with breakfast.    inFLIXimab (REMICADE) 100 MG injection Inject 100 mg into the vein.    loratadine (CLARITIN) 10 MG tablet Take 10 mg by mouth daily as needed for allergies.     ondansetron (ZOFRAN ODT) 4 MG disintegrating tablet Take 1 tablet (4 mg total) by mouth every 4 (four) hours as needed for nausea or vomiting. Qty: 20 tablet, Refills: 0       Allergies  Allergen Reactions  . Chocolate Anaphylaxis  . Latex Hives and Swelling  . Nickel Hives   Follow-up Information    Follow up with Irving CopasHACKER,ROBERT KELLER, MD In 2 weeks.   Specialty:  Family Medicine   Contact information:   [id number] [place] [id number] [phone]        The results of significant diagnostics from this hospitalization (including imaging, microbiology, ancillary and laboratory) are listed below for reference.    Significant Diagnostic Studies: Ct Abdomen Pelvis W Contrast  11/23/2014   CLINICAL DATA:  Fever. Diarrhea with bright red blood in stool. Abdominal pain. History of ulcerative colitis.  EXAM: CT ABDOMEN AND PELVIS WITH CONTRAST  TECHNIQUE: Multidetector CT imaging of the abdomen and pelvis was performed using the standard protocol following bolus administration of intravenous contrast.  CONTRAST:  100mL OMNIPAQUE IOHEXOL 300 MG/ML  SOLN  COMPARISON:  10/26/2014  FINDINGS: Dependent atelectasis in the lung bases. No effusions. Heart is normal size.  Low-density area noted adjacent to the falciform ligament in the liver compatible with focal fatty infiltration. Otherwise liver is unremarkable. Gallbladder, spleen, pancreas, adrenals and kidneys are unremarkable. Small nonobstructing stone in the mid to lower pole of the right kidney, stable. No ureteral stones or  hydronephrosis.  There is diffuse colonic wall thickening, from the hepatic flexure to the rectosigmoid colon, most pronounced in the sigmoid colon and descending colon. These findings have progressed since prior study. Mild surrounding inflammatory stranding around the affected colon. Findings likely reflect changes related to patient's known ulcerative colitis. Stomach and small bowel are decompressed. No evidence of obstruction. Retrocecal appendix again noted and unchanged, upper limits normal in diameter.  Mildly prominent pericolonic lymph nodes are stable. Trace free fluid in the pelvis.  Uterus, adnexae and urinary bladder are unremarkable. Aorta is normal caliber.  IMPRESSION: Worsening wall thickening and inflammatory change in the colon from the hepatic flexure to the rectosigmoid colon, most pronounced in the descending colon and sigmoid colon compatible with worsening ulcerative colitis. Stable prominent pericolonic mesenteric lymph nodes.   Electronically Signed   By: Charlett NoseKevin  Dover M.D.   On: 11/23/2014 00:58    Microbiology: Recent Results (from the past 240 hour(s))  Stool culture     Status: None   Collection Time: 11/23/14  2:51 PM  Result Value Ref Range Status   Specimen Description STOOL  Final   Special Requests NONE  Final   Culture   Final    NO SALMONELLA, SHIGELLA, CAMPYLOBACTER, YERSINIA, OR E.COLI 0157:H7 ISOLATED Performed at [facility]    Report Status 11/29/2014 FINAL  Final  Clostridium Difficile by PCR     Status: None   Collection Time: 11/23/14  2:51 PM  Result Value Ref Range Status   C difficile by pcr NEGATIVE NEGATIVE Final    Comment: Performed at [facility]     Labs: Basic Metabolic Panel:  Recent Labs Lab 11/27/14 0525 11/28/14 0504 11/29/14 0532 11/30/14 0438 12/02/14 0504  NA 141 137 137 136 138  K 3.4* 4.0 3.9 4.3 3.8  CL 101 100 103 99 102  CO2 GLUCOSE 92 152* 141* 141* 171*  BUN CREATININE 0.69 0.62 0.71 0.63 0.78  CALCIUM 8.5 8.8 8.8 9.0 8.6   Liver Function Tests:  Recent Labs Lab 11/30/14 0438  AST 16  ALT 11  ALKPHOS 41  BILITOT 0.6  PROT 6.7  ALBUMIN 2.9*   No results for input(s): LIPASE, AMYLASE in the last 168 hours. No results for input(s): AMMONIA in the last 168 hours. CBC:  Recent Labs Lab 11/27/14 0525 11/28/14 0504 11/29/14 0532 11/30/14 0500 12/02/14 0504  WBC 4.8 4.2 7.6 7.9 8.8  NEUTROABS  --   --  6.7 6.9  --   HGB 10.7* 11.9* 11.7* 11.9* 11.5*  HCT 34.8* 35.6* 37.3 38.5 37.5  MCV 84.7 83.6 84.4 84.6 85.8  PLT 331 353 396 358 338   Cardiac Enzymes: No results for input(s): CKTOTAL, CKMB, CKMBINDEX, TROPONINI in the last 168 hours. BNP: BNP (last 3 results) No results for input(s): PROBNP in the last 8760 hours. CBG: No results for input(s): GLUCAP in the last 168 hours.     Signed:  Marinda Elk  [facility] 12/03/2014, 10:01 AM

## 2015-01-17 ENCOUNTER — Emergency Department (HOSPITAL_COMMUNITY): Payer: No Typology Code available for payment source

## 2015-01-17 ENCOUNTER — Encounter (HOSPITAL_COMMUNITY): Payer: Self-pay | Admitting: Emergency Medicine

## 2015-01-17 ENCOUNTER — Emergency Department (HOSPITAL_COMMUNITY)
Admission: EM | Admit: 2015-01-17 | Discharge: 2015-01-17 | Disposition: A | Discharge status: Home / Self Care | Payer: No Typology Code available for payment source | Attending: Emergency Medicine | Admitting: Emergency Medicine

## 2015-01-17 DIAGNOSIS — R51 Headache: Secondary | ICD-10-CM | POA: Diagnosis not present

## 2015-01-17 DIAGNOSIS — Z8639 Personal history of other endocrine, nutritional and metabolic disease: Secondary | ICD-10-CM | POA: Diagnosis not present

## 2015-01-17 DIAGNOSIS — Z3202 Encounter for pregnancy test, result negative: Secondary | ICD-10-CM | POA: Insufficient documentation

## 2015-01-17 DIAGNOSIS — R519 Headache, unspecified: Secondary | ICD-10-CM

## 2015-01-17 DIAGNOSIS — R110 Nausea: Secondary | ICD-10-CM | POA: Insufficient documentation

## 2015-01-17 DIAGNOSIS — Z7952 Long term (current) use of systemic steroids: Secondary | ICD-10-CM | POA: Insufficient documentation

## 2015-01-17 DIAGNOSIS — Z8719 Personal history of other diseases of the digestive system: Secondary | ICD-10-CM | POA: Insufficient documentation

## 2015-01-17 DIAGNOSIS — I10 Essential (primary) hypertension: Secondary | ICD-10-CM | POA: Insufficient documentation

## 2015-01-17 DIAGNOSIS — Z91040 Latex allergy status: Secondary | ICD-10-CM | POA: Insufficient documentation

## 2015-01-17 DIAGNOSIS — Z79899 Other long term (current) drug therapy: Secondary | ICD-10-CM | POA: Insufficient documentation

## 2015-01-17 DIAGNOSIS — Z87891 Personal history of nicotine dependence: Secondary | ICD-10-CM | POA: Diagnosis not present

## 2015-01-17 LAB — POC URINE PREG, ED: PREG TEST UR: NEGATIVE

## 2015-01-17 MED ORDER — KETOROLAC TROMETHAMINE 30 MG/ML IJ SOLN
30.0000 mg | Freq: Once | INTRAMUSCULAR | Status: AC
  Administered 2015-01-17: 30 mg via INTRAVENOUS
  Filled 2015-01-17: qty 1

## 2015-01-17 MED ORDER — MAGNESIUM SULFATE IN D5W 10-5 MG/ML-% IV SOLN
1.0000 g | Freq: Once | INTRAVENOUS | Status: AC
  Administered 2015-01-17: 1 g via INTRAVENOUS
  Filled 2015-01-17: qty 100

## 2015-01-17 MED ORDER — KETOROLAC TROMETHAMINE 10 MG PO TABS: 10.0000 mg | ORAL_TABLET | Freq: Four times a day (QID) | ORAL | Status: DC | PRN

## 2015-01-17 MED ORDER — SODIUM CHLORIDE 0.9 % IV BOLUS (SEPSIS)
1000.0000 mL | Freq: Once | INTRAVENOUS | Status: AC
  Administered 2015-01-17: 1000 mL via INTRAVENOUS

## 2015-01-17 MED ORDER — METOCLOPRAMIDE HCL 5 MG/ML IJ SOLN
10.0000 mg | Freq: Once | INTRAMUSCULAR | Status: AC
  Administered 2015-01-17: 10 mg via INTRAVENOUS
  Filled 2015-01-17: qty 2

## 2015-01-17 MED ORDER — DIPHENHYDRAMINE HCL 50 MG/ML IJ SOLN
25.0000 mg | Freq: Once | INTRAMUSCULAR | Status: AC
  Administered 2015-01-17: 25 mg via INTRAVENOUS
  Filled 2015-01-17: qty 1

## 2015-01-17 MED ORDER — DEXAMETHASONE SODIUM PHOSPHATE 10 MG/ML IJ SOLN
10.0000 mg | Freq: Once | INTRAMUSCULAR | Status: AC
  Administered 2015-01-17: 10 mg via INTRAVENOUS
  Filled 2015-01-17: qty 1

## 2015-01-17 NOTE — ED Provider Notes (Signed)
CSN: [id number]     Arrival date & time 01/17/15  1444 History   First MD Initiated Contact with Patient 01/17/15 1503     Chief Complaint  Patient presents with  . Migraine     (Consider location/radiation/quality/duration/timing/severity/associated sxs/prior Treatment) HPI Comments: R sided headache with associated R eye twitching. No double vision, no blurry vision. Associated photosensitivity, nausea. No vomiting or fever.  Patient is a 34 y.o. female presenting with migraines. The history is provided by the patient.  Migraine This is a recurrent problem. The current episode started more than 1 week ago. The problem occurs constantly. The problem has not changed since onset.Associated symptoms include headaches. Pertinent negatives include no chest pain, no abdominal pain and no shortness of breath. Nothing aggravates the symptoms. Nothing relieves the symptoms.    Past Medical History  Diagnosis Date  . Irregular heart beat   . Ulcerative colitis   . PCOS (polycystic ovarian syndrome)   . Hypertension    Past Surgical History  Procedure Laterality Date  . Wisdom tooth extraction    . Colectomy     Family History  Problem Relation Age of Onset  . Anesthesia problems Neg Hx    History  Substance Use Topics  . Smoking status: Former Smoker    Quit date: 11/28/2011  . Smokeless tobacco: Never Used  . Alcohol Use: Yes     Comment: occas., once a month or less.   OB History    Gravida Para Term Preterm AB TAB SAB Ectopic Multiple Living   0              Review of Systems  Constitutional: Negative for fever and chills.  Respiratory: Negative for shortness of breath.   Cardiovascular: Negative for chest pain.  Gastrointestinal: Positive for nausea. Negative for vomiting, abdominal pain and diarrhea.  Neurological: Positive for headaches.  All other systems reviewed and are negative.     Allergies  Chocolate; Latex; and Nickel  Home Medications   Prior to  Admission medications   Medication Sig Start Date End Date Taking? Authorizing Provider  escitalopram (LEXAPRO) 20 MG tablet Take 20 mg by mouth at bedtime.    Yes Historical Provider, MD  Norgestimate-Ethinyl Estradiol Triphasic (TRINESSA, 28,) 0.18/0.215/0.25 MG-35 MCG tablet Take 1 tablet by mouth at bedtime.   Yes Historical Provider, MD  pantoprazole (PROTONIX) 40 MG tablet Take 40 mg by mouth at bedtime.    Yes Historical Provider, MD  predniSONE (DELTASONE) 5 MG tablet Take 5 mg by mouth daily with breakfast.   Yes Historical Provider, MD  cetirizine (ZYRTEC) 10 MG tablet Take 10 mg by mouth daily as needed for allergies.     Historical Provider, MD  diazepam (VALIUM) 5 MG tablet Take 1 tablet (5 mg total) by mouth every 12 (twelve) hours as needed for muscle spasms. Patient not taking: Reported on 01/17/2015 10/26/14   Antony Madura, PA-C  HYDROcodone-acetaminophen (NORCO/VICODIN) 5-325 MG per tablet Take 1-2 tablets by mouth every 6 (six) hours as needed for moderate pain or severe pain. Patient not taking: Reported on 01/17/2015 10/26/14   Antony Madura, PA-C  loratadine (CLARITIN) 10 MG tablet Take 10 mg by mouth daily as needed for allergies.     Historical Provider, MD  mercaptopurine (PURINETHOL) 50 MG tablet Take 2 tablets (100 mg total) by mouth daily. Give on an empty stomach 1 hour before or 2 hours after meals. Caution: Chemotherapy. Patient not taking: Reported on 01/17/2015 12/03/14   Darin Engels  David StallFeliz Ortiz, MD  Mesalamine (ASACOL) 400 MG CPDR DR capsule Take 2 capsules (800 mg total) by mouth 3 (three) times daily. Patient not taking: Reported on 01/17/2015 12/03/14   Marinda ElkAbraham Feliz Ortiz, MD  methylPREDNISolone sodium succinate (SOLU-MEDROL) 125 mg/2 mL injection Inject 0.96 mLs (60 mg total) into the vein every 6 (six) hours. Patient not taking: Reported on 01/17/2015 12/03/14   Marinda ElkAbraham Feliz Ortiz, MD  ondansetron (ZOFRAN ODT) 4 MG disintegrating tablet Take 1 tablet (4 mg total) by mouth  every 4 (four) hours as needed for nausea or vomiting. Patient not taking: Reported on 01/17/2015 08/29/14   Arby BarretteMarcy Pfeiffer, MD   BP 155/87 mmHg  Pulse 94  Temp(Src) 98.3 F (36.8 C) (Oral)  Resp 16  SpO2 98%  LMP 12/28/2014 Physical Exam  Constitutional: She is oriented to person, place, and time. She appears well-developed and well-nourished. No distress.  HENT:  Head: Normocephalic and atraumatic.  Mouth/Throat: Oropharynx is clear and moist.  Eyes: EOM are normal. Pupils are equal, round, and reactive to light.  Neck: Normal range of motion. Neck supple.  Cardiovascular: Normal rate and regular rhythm.  Exam reveals no friction rub.   No murmur heard. Pulmonary/Chest: Effort normal and breath sounds normal. No respiratory distress. She has no wheezes. She has no rales.  Abdominal: Soft. She exhibits no distension. There is no tenderness. There is no rebound.  Musculoskeletal: Normal range of motion. She exhibits no edema.  Neurological: She is alert and oriented to person, place, and time. No cranial nerve deficit. She exhibits normal muscle tone. Coordination normal.  Skin: She is not diaphoretic.  Nursing note and vitals reviewed.   ED Course  Procedures (including critical care time) Labs Review Labs Reviewed - No data to display  Imaging Review Ct Head Wo Contrast  01/17/2015   CLINICAL DATA:  Headache starting yesterday  EXAM: CT HEAD WITHOUT CONTRAST  TECHNIQUE: Contiguous axial images were obtained from the base of the skull through the vertex without intravenous contrast.  COMPARISON:  None.  FINDINGS: No skull fracture is noted. Paranasal sinuses and mastoid air cells are unremarkable. No intracranial hemorrhage, mass effect or midline shift. No acute cortical infarction. No mass either there is noted on this unenhanced scan. The gray and white-matter differentiation is preserved. No intra or extra-axial fluid collection.  IMPRESSION: No acute intracranial abnormality.    Electronically Signed   By: Natasha MeadLiviu  Pop M.D.   On: 01/17/2015 15:52     EKG Interpretation None      MDM   Final diagnoses:  Headache    34 year old female here with headaches. Recurrent for the past couple weeks since released from the hospital after total colectomy for ulcerative colitis. 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 Headaches will come and go for 2 days at a time, then leave for 1 day, then come back. She has not seen anyone in the ER yet. She denies any fever, blurred vision, double vision, vomiting. She does have some nausea and photosensitivity. She has spoken her primary doctor and they discussed that this could be due to tapering steroids, weaning off of narcotics. Line here she is well-appearing but obviously having a headache. She is sensitive to light. Neurovascularly she is intact and has a normal neuro exam with normal cranial nerves, good strength and sensation in all extremities. Plan for CT since she's never had headaches before and give migraine cocktail. Described as right sided, throbbing, with radiation into the back of her head. CT normal.  HA improved with migraine cocktail going from a 10/10 to a 5/10. Patient stable for discharge, can f/u with PCP.  Elwin Mocha, MD 01/17/15 [phone]

## 2015-01-17 NOTE — ED Notes (Signed)
Pt alert, oriented, and ambulatory upon DC. She was advised to follow up with PCP in 1 day.

## 2015-01-17 NOTE — ED Notes (Signed)
Pt ambulated to restroom with steady gait.

## 2015-01-17 NOTE — ED Notes (Signed)
Pt c/o headache since yesterday.  Pt had total colectomy on January 25th and ever since then she has experienced headaches. Pt c/o pain over her R eye and posterior head. Pt c/o Nausea, no vomiting. Pt c/o light sensitivity, hearing sensitivity. Ambulatory to triage.

## 2015-01-17 NOTE — Discharge Instructions (Signed)

## 2015-02-03 ENCOUNTER — Emergency Department (HOSPITAL_COMMUNITY): Payer: No Typology Code available for payment source

## 2015-02-03 ENCOUNTER — Emergency Department (HOSPITAL_COMMUNITY)
Admission: EM | Admit: 2015-02-03 | Discharge: 2015-02-04 | Disposition: A | Discharge status: Home / Self Care | Payer: No Typology Code available for payment source | Attending: Emergency Medicine | Admitting: Emergency Medicine

## 2015-02-03 ENCOUNTER — Encounter (HOSPITAL_COMMUNITY): Payer: Self-pay | Admitting: Emergency Medicine

## 2015-02-03 DIAGNOSIS — R509 Fever, unspecified: Secondary | ICD-10-CM | POA: Diagnosis present

## 2015-02-03 DIAGNOSIS — Z8719 Personal history of other diseases of the digestive system: Secondary | ICD-10-CM | POA: Insufficient documentation

## 2015-02-03 DIAGNOSIS — R112 Nausea with vomiting, unspecified: Secondary | ICD-10-CM | POA: Diagnosis not present

## 2015-02-03 DIAGNOSIS — Z87891 Personal history of nicotine dependence: Secondary | ICD-10-CM | POA: Diagnosis not present

## 2015-02-03 DIAGNOSIS — Z79899 Other long term (current) drug therapy: Secondary | ICD-10-CM | POA: Diagnosis not present

## 2015-02-03 DIAGNOSIS — R51 Headache: Secondary | ICD-10-CM | POA: Insufficient documentation

## 2015-02-03 DIAGNOSIS — Z7952 Long term (current) use of systemic steroids: Secondary | ICD-10-CM | POA: Diagnosis not present

## 2015-02-03 DIAGNOSIS — M542 Cervicalgia: Secondary | ICD-10-CM | POA: Diagnosis not present

## 2015-02-03 DIAGNOSIS — Z91040 Latex allergy status: Secondary | ICD-10-CM | POA: Diagnosis not present

## 2015-02-03 DIAGNOSIS — J029 Acute pharyngitis, unspecified: Secondary | ICD-10-CM | POA: Insufficient documentation

## 2015-02-03 DIAGNOSIS — Z8639 Personal history of other endocrine, nutritional and metabolic disease: Secondary | ICD-10-CM | POA: Diagnosis not present

## 2015-02-03 DIAGNOSIS — Z3202 Encounter for pregnancy test, result negative: Secondary | ICD-10-CM | POA: Insufficient documentation

## 2015-02-03 DIAGNOSIS — R0981 Nasal congestion: Secondary | ICD-10-CM | POA: Insufficient documentation

## 2015-02-03 DIAGNOSIS — M791 Myalgia: Secondary | ICD-10-CM | POA: Diagnosis not present

## 2015-02-03 DIAGNOSIS — I10 Essential (primary) hypertension: Secondary | ICD-10-CM | POA: Insufficient documentation

## 2015-02-03 DIAGNOSIS — R519 Headache, unspecified: Secondary | ICD-10-CM

## 2015-02-03 DIAGNOSIS — R05 Cough: Secondary | ICD-10-CM | POA: Insufficient documentation

## 2015-02-03 LAB — LIPASE, BLOOD: Lipase: 26 U/L (ref 11–59)

## 2015-02-03 LAB — CBC WITH DIFFERENTIAL/PLATELET
Basophils Absolute: 0 10*3/uL (ref 0.0–0.1)
Basophils Relative: 0 % (ref 0–1)
EOS ABS: 0 10*3/uL (ref 0.0–0.7)
Eosinophils Relative: 1 % (ref 0–5)
HCT: 35.9 % — ABNORMAL LOW (ref 36.0–46.0)
Hemoglobin: 11.5 g/dL — ABNORMAL LOW (ref 12.0–15.0)
LYMPHS PCT: 20 % (ref 12–46)
Lymphs Abs: 0.8 10*3/uL (ref 0.7–4.0)
MCH: 26.6 pg (ref 26.0–34.0)
MCHC: 32 g/dL (ref 30.0–36.0)
MCV: 82.9 fL (ref 78.0–100.0)
MONO ABS: 0.7 10*3/uL (ref 0.1–1.0)
Monocytes Relative: 17 % — ABNORMAL HIGH (ref 3–12)
NEUTROS ABS: 2.4 10*3/uL (ref 1.7–7.7)
Neutrophils Relative %: 62 % (ref 43–77)
Platelets: 214 10*3/uL (ref 150–400)
RBC: 4.33 MIL/uL (ref 3.87–5.11)
RDW: 14.3 % (ref 11.5–15.5)
WBC: 4 10*3/uL (ref 4.0–10.5)

## 2015-02-03 LAB — COMPREHENSIVE METABOLIC PANEL
ALK PHOS: 54 U/L (ref 39–117)
ALT: 15 U/L (ref 0–35)
ANION GAP: 10 (ref 5–15)
AST: 22 U/L (ref 0–37)
Albumin: 3.5 g/dL (ref 3.5–5.2)
BILIRUBIN TOTAL: 0.5 mg/dL (ref 0.3–1.2)
BUN: 7 mg/dL (ref 6–23)
CALCIUM: 8.6 mg/dL (ref 8.4–10.5)
CHLORIDE: 102 mmol/L (ref 96–112)
CO2: 24 mmol/L (ref 19–32)
CREATININE: 0.73 mg/dL (ref 0.50–1.10)
GFR calc non Af Amer: >90 mL/min (ref >90)
GLUCOSE: 111 mg/dL — AB (ref 70–99)
POTASSIUM: 3.4 mmol/L — AB (ref 3.5–5.1)
Sodium: 136 mmol/L (ref 135–145)
Total Protein: 7 g/dL (ref 6.0–8.3)

## 2015-02-03 LAB — URINALYSIS, ROUTINE W REFLEX MICROSCOPIC
Bilirubin Urine: NEGATIVE
GLUCOSE, UA: NEGATIVE mg/dL
KETONES UR: NEGATIVE mg/dL
Leukocytes, UA: NEGATIVE
Nitrite: NEGATIVE
PH: 6.5 (ref 5.0–8.0)
Protein, ur: NEGATIVE mg/dL
Specific Gravity, Urine: 1.005 (ref 1.005–1.030)
Urobilinogen, UA: 0.2 mg/dL (ref 0.0–1.0)

## 2015-02-03 LAB — RAPID STREP SCREEN (MED CTR MEBANE ONLY): STREPTOCOCCUS, GROUP A SCREEN (DIRECT): NEGATIVE

## 2015-02-03 LAB — URINE MICROSCOPIC-ADD ON

## 2015-02-03 LAB — PREGNANCY, URINE: Preg Test, Ur: NEGATIVE

## 2015-02-03 MED ORDER — METOCLOPRAMIDE HCL 5 MG/ML IJ SOLN
10.0000 mg | Freq: Once | INTRAMUSCULAR | Status: AC
  Administered 2015-02-03: 10 mg via INTRAVENOUS
  Filled 2015-02-03: qty 2

## 2015-02-03 MED ORDER — DIPHENHYDRAMINE HCL 50 MG/ML IJ SOLN
25.0000 mg | Freq: Once | INTRAMUSCULAR | Status: AC
  Administered 2015-02-03: 25 mg via INTRAVENOUS
  Filled 2015-02-03: qty 1

## 2015-02-03 MED ORDER — SODIUM CHLORIDE 0.9 % IV BOLUS (SEPSIS)
1000.0000 mL | Freq: Once | INTRAVENOUS | Status: AC
  Administered 2015-02-04: 1000 mL via INTRAVENOUS

## 2015-02-03 MED ORDER — MORPHINE SULFATE 4 MG/ML IJ SOLN
4.0000 mg | Freq: Once | INTRAMUSCULAR | Status: AC
  Administered 2015-02-03: 4 mg via INTRAVENOUS
  Filled 2015-02-03: qty 1

## 2015-02-03 NOTE — ED Notes (Signed)
Consult signed for LP.

## 2015-02-03 NOTE — ED Notes (Addendum)
Pt states she has had a temp of 103 since Monday, started vomiting today and has had a migraine for a couple of days now. Pt also has been c/p neck pain and watery output from ileostomy.

## 2015-02-03 NOTE — ED Notes (Signed)
Dr. Fredderick PhenixBelfi at bedside performing lumbar puncture.

## 2015-02-03 NOTE — ED Provider Notes (Signed)
CSN: [id number]     Arrival date & time 02/03/15  1308 History   First MD Initiated Contact with Patient 02/03/15 1810     Chief Complaint  Patient presents with  . Fever  . Migraine  . Nausea     (Consider location/radiation/quality/duration/timing/severity/associated sxs/prior Treatment) HPI Comments: Patient with a history of hypertension and ulcerative colitis presents with fever and migraine. She had a partial colectomy with colostomy placement in January due to his ulcerative colitis. Since that time she's been having ongoing migraines. She was seen in the ED last month for her migraine. She does not have a history of migraines prior to this. It was felt that maybe the migraines are related to her tapering her steroids. She's been on chronic prednisone and currently is on 2 mg. She states over the last 10 days she's had worsening headaches associated with neck pain. She's had fevers up to 101 starting 4 days ago. She is achy all over. She's had a cough productive of some yellow sputum. She has a little bit of a sore throat. She's had some nauseousness and vomiting associated with the headache. She's had a little bit of increased watery output from the colostomy bag but denies any abdominal pain. She denies any urinary symptoms. She says her neck pain shoots all the way down her back. She also has had some associated photophobia.  Patient is a 34 y.o. female presenting with fever and migraines.  Fever Associated symptoms: congestion, cough, headaches, myalgias, nausea, sore throat (None now) and vomiting   Associated symptoms: no chest pain, no chills, no diarrhea, no rash and no rhinorrhea   Migraine Associated symptoms include headaches. Pertinent negatives include no chest pain, no abdominal pain and no shortness of breath.    Past Medical History  Diagnosis Date  . Irregular heart beat   . Ulcerative colitis   . PCOS (polycystic ovarian syndrome)   . Hypertension    Past Surgical  History  Procedure Laterality Date  . Wisdom tooth extraction    . Colectomy     Family History  Problem Relation Age of Onset  . Anesthesia problems Neg Hx    History  Substance Use Topics  . Smoking status: Former Smoker    Quit date: 11/28/2011  . Smokeless tobacco: Never Used  . Alcohol Use: Yes     Comment: occas., once a month or less.   OB History    Gravida Para Term Preterm AB TAB SAB Ectopic Multiple Living   0              Review of Systems  Constitutional: Positive for fever and fatigue. Negative for chills and diaphoresis.  HENT: Positive for congestion and sore throat (None now). Negative for rhinorrhea and sneezing.   Eyes: Negative.   Respiratory: Positive for cough. Negative for chest tightness and shortness of breath.   Cardiovascular: Negative for chest pain and leg swelling.  Gastrointestinal: Positive for nausea and vomiting. Negative for abdominal pain, diarrhea and blood in stool.  Genitourinary: Negative for frequency, hematuria, flank pain and difficulty urinating.  Musculoskeletal: Positive for myalgias, back pain and neck pain. Negative for arthralgias.  Skin: Negative for rash.  Neurological: Positive for headaches. Negative for dizziness, speech difficulty, weakness and numbness.      Allergies  Chocolate; Latex; and Nickel  Home Medications   Prior to Admission medications   Medication Sig Start Date End Date Taking? Authorizing Provider  acetaminophen (TYLENOL) 500 MG tablet Take 500  mg by mouth every 6 (six) hours as needed for mild pain.   Yes Historical Provider, MD  cetirizine (ZYRTEC) 10 MG tablet Take 10 mg by mouth daily as needed for allergies.    Yes Historical Provider, MD  escitalopram (LEXAPRO) 20 MG tablet Take 20 mg by mouth at bedtime.    Yes Historical Provider, MD  ibuprofen (ADVIL,MOTRIN) 200 MG tablet Take 200 mg by mouth every 6 (six) hours as needed for moderate pain.   Yes Historical Provider, MD  loratadine  (CLARITIN) 10 MG tablet Take 10 mg by mouth daily as needed for allergies.    Yes Historical Provider, MD  Norgestimate-Ethinyl Estradiol Triphasic (TRINESSA, 28,) 0.18/0.215/0.25 MG-35 MCG tablet Take 1 tablet by mouth at bedtime.   Yes Historical Provider, MD  pantoprazole (PROTONIX) 40 MG tablet Take 40 mg by mouth at bedtime.    Yes Historical Provider, MD  predniSONE (DELTASONE) 5 MG tablet Take 2.5 mg by mouth daily with breakfast.    Yes Historical Provider, MD  diazepam (VALIUM) 5 MG tablet Take 1 tablet (5 mg total) by mouth every 12 (twelve) hours as needed for muscle spasms. Patient not taking: Reported on 01/17/2015 10/26/14   Antony Madura, PA-C  HYDROcodone-acetaminophen (NORCO/VICODIN) 5-325 MG per tablet Take 1-2 tablets by mouth every 6 (six) hours as needed for moderate pain or severe pain. Patient not taking: Reported on 01/17/2015 10/26/14   Antony Madura, PA-C  ketorolac (TORADOL) 10 MG tablet Take 1 tablet (10 mg total) by mouth every 6 (six) hours as needed for moderate pain. Patient not taking: Reported on 02/03/2015 01/17/15   Elwin Mocha, MD  mercaptopurine (PURINETHOL) 50 MG tablet Take 2 tablets (100 mg total) by mouth daily. Give on an empty stomach 1 hour before or 2 hours after meals. Caution: Chemotherapy. Patient not taking: Reported on 01/17/2015 12/03/14   Marinda Elk, MD  Mesalamine (ASACOL) 400 MG CPDR DR capsule Take 2 capsules (800 mg total) by mouth 3 (three) times daily. Patient not taking: Reported on 01/17/2015 12/03/14   Marinda Elk, MD  methylPREDNISolone sodium succinate (SOLU-MEDROL) 125 mg/2 mL injection Inject 0.96 mLs (60 mg total) into the vein every 6 (six) hours. Patient not taking: Reported on 01/17/2015 12/03/14   Marinda Elk, MD  ondansetron (ZOFRAN ODT) 4 MG disintegrating tablet Take 1 tablet (4 mg total) by mouth every 4 (four) hours as needed for nausea or vomiting. Patient not taking: Reported on 01/17/2015 08/29/14   Arby Barrette,  MD   BP 110/65 mmHg  Pulse 81  Temp(Src) 98.8 F (37.1 C) (Oral)  Resp 18  SpO2 97%  LMP 01/28/2015 (Exact Date) Physical Exam  Constitutional: She is oriented to person, place, and time. She appears well-developed and well-nourished.  HENT:  Head: Normocephalic and atraumatic.  Mouth/Throat: Oropharynx is clear and moist.  Eyes: Pupils are equal, round, and reactive to light.  Neck: Normal range of motion. Neck supple.  Tenderness along the paraspinal muscles bilaterally  Cardiovascular: Normal rate, regular rhythm and normal heart sounds.   Pulmonary/Chest: Effort normal and breath sounds normal. No respiratory distress. She has no wheezes. She has no rales. She exhibits no tenderness.  Abdominal: Soft. Bowel sounds are normal. There is no tenderness. There is no rebound and no guarding.  Musculoskeletal: Normal range of motion. She exhibits no edema.  Lymphadenopathy:    She has no cervical adenopathy.  Neurological: She is alert and oriented to person, place, and time. She has normal  strength. No cranial nerve deficit or sensory deficit. GCS eye subscore is 4. GCS verbal subscore is 5. GCS motor subscore is 6.  Skin: Skin is warm and dry. No rash noted.  Psychiatric: She has a normal mood and affect.    ED Course  LUMBAR PUNCTURE Date/Time: 02/03/2015 9:30 PM Performed by: Lilia Letterman Authorized by: Rolan BuccoBELFI, Kamariah Fruchter Consent: Written consent obtained. Risks and benefits: risks, benefits and alternatives were discussed Consent given by: patient and spouse Patient understanding: patient states understanding of the procedure being performed Patient consent: the patient's understanding of the procedure matches consent given Procedure consent: procedure consent matches procedure scheduled Relevant documents: relevant documents present and verified Test results: test results available and properly labeled Imaging studies: imaging studies available Patient identity confirmed:  verbally with patient and arm band Time out: Immediately prior to procedure a "time out" was called to verify the correct patient, procedure, equipment, support staff and site/side marked as required. Indications: evaluation for infection Anesthesia: local infiltration Local anesthetic: lidocaine 1% without epinephrine Anesthetic total: 2 ml Patient sedated: no Preparation: Patient was prepped and draped in the usual sterile fashion. Lumbar space: L4-L5 interspace Patient's position: sitting Needle gauge: 20 Needle type: diamond point Needle length: 3.5 in Number of attempts: 1 Fluid appearance: clear Tubes of fluid: 4 Total volume: 4 ml Post-procedure: site cleaned and adhesive bandage applied Patient tolerance: Patient tolerated the procedure well with no immediate complications   (including critical care time) Labs Review Labs Reviewed  CBC WITH DIFFERENTIAL/PLATELET - Abnormal; Notable for the following:    Hemoglobin 11.5 (*)    HCT 35.9 (*)    Monocytes Relative 17 (*)    All other components within normal limits  COMPREHENSIVE METABOLIC PANEL - Abnormal; Notable for the following:    Potassium 3.4 (*)    Glucose, Bld 111 (*)    All other components within normal limits  URINALYSIS, ROUTINE W REFLEX MICROSCOPIC - Abnormal; Notable for the following:    APPearance CLOUDY (*)    Hgb urine dipstick TRACE (*)    All other components within normal limits  RAPID STREP SCREEN  CULTURE, GROUP A STREP  CSF CULTURE  GRAM STAIN  LIPASE, BLOOD  PREGNANCY, URINE  URINE MICROSCOPIC-ADD ON  PROTEIN AND GLUCOSE, CSF  CSF CELL COUNT WITH DIFFERENTIAL  CSF CELL COUNT WITH DIFFERENTIAL    Imaging Review Dg Chest 2 View  02/03/2015   CLINICAL DATA:  Fever since Monday. Vomiting today. Migraine for couple of days. Neck pain and watery output from ileostomy. History of hypertension. History of ulcerative colitis.  EXAM: CHEST  2 VIEW  COMPARISON:  CT abdomen and pelvis 12/18/2013   FINDINGS: The heart size and mediastinal contours are within normal limits. Both lungs are clear. The visualized skeletal structures are unremarkable.  IMPRESSION: No active cardiopulmonary disease.   Electronically Signed   By: Burman NievesWilliam  Stevens M.D.   On: 02/03/2015 19:30   Ct Head Wo Contrast  02/03/2015   CLINICAL DATA:  Posterior headache for 10 days.  No known trauma.  EXAM: CT HEAD WITHOUT CONTRAST  TECHNIQUE: Contiguous axial images were obtained from the base of the skull through the vertex without intravenous contrast.  COMPARISON:  01/17/2015  FINDINGS: Gray-white differentiation is maintained. No CT evidence of acute large territory infarct. No intraparenchymal or extra-axial mass or hemorrhage. Normal size and configuration of the ventricles and basilar cisterns. No midline shift. Limited visualization of the paranasal sinuses and mastoid air cells is normal. No  air-fluid levels. Regional soft tissues appear normal. No displaced calvarial fracture.  IMPRESSION: Negative noncontrast head CT.   Electronically Signed   By: Simonne Come M.D.   On: 02/03/2015 19:50     EKG Interpretation None      MDM   Final diagnoses:  Febrile illness  Headache, unspecified headache type   Pt presents with febrile illness.  No signs of pneumonia.  No UTI.  No significant abd pain.  LP performed to assess for meningitis given the H/A, neck pain and fever.  Pt feeling much better after meds.  Dr. Nicanor Alcon to take over pending CSF results.  If neg, I anticipate pt being discharged with likely viral syndrome.    Rolan Bucco, MD 02/04/15 [phone]

## 2015-02-04 LAB — CSF CELL COUNT WITH DIFFERENTIAL
RBC COUNT CSF: 3 /mm3 — AB
RBC COUNT CSF: 3 /mm3 — AB
TUBE #: 4
Tube #: 1
WBC CSF: 3 /mm3 (ref 0–5)
WBC, CSF: 2 /mm3 (ref 0–5)

## 2015-02-04 LAB — GRAM STAIN: SPECIAL REQUESTS: NORMAL

## 2015-02-04 LAB — PROTEIN AND GLUCOSE, CSF
Glucose, CSF: 57 mg/dL (ref 43–76)
Total  Protein, CSF: 23 mg/dL (ref 15–45)

## 2015-02-04 MED ORDER — MORPHINE SULFATE 4 MG/ML IJ SOLN
4.0000 mg | Freq: Once | INTRAMUSCULAR | Status: AC
  Administered 2015-02-04: 4 mg via INTRAVENOUS
  Filled 2015-02-04: qty 1

## 2015-02-04 MED ORDER — IBUPROFEN 800 MG PO TABS: 800.0000 mg | ORAL_TABLET | Freq: Three times a day (TID) | ORAL | Status: DC

## 2015-02-06 LAB — CULTURE, GROUP A STREP: Strep A Culture: NEGATIVE

## 2015-02-07 LAB — CSF CULTURE W GRAM STAIN
Culture: NO GROWTH
Special Requests: NORMAL

## 2015-02-07 LAB — CSF CULTURE

## 2015-04-28 HISTORY — PX: OTHER SURGICAL HISTORY: SHX169

## 2015-06-04 ENCOUNTER — Emergency Department (HOSPITAL_COMMUNITY)
Admission: EM | Admit: 2015-06-04 | Discharge: 2015-06-05 | Disposition: A | Discharge status: Home / Self Care | Payer: No Typology Code available for payment source | Attending: Emergency Medicine | Admitting: Emergency Medicine

## 2015-06-04 ENCOUNTER — Encounter (HOSPITAL_COMMUNITY): Payer: Self-pay | Admitting: Emergency Medicine

## 2015-06-04 ENCOUNTER — Emergency Department (HOSPITAL_COMMUNITY): Payer: No Typology Code available for payment source

## 2015-06-04 DIAGNOSIS — Z792 Long term (current) use of antibiotics: Secondary | ICD-10-CM | POA: Insufficient documentation

## 2015-06-04 DIAGNOSIS — R1084 Generalized abdominal pain: Secondary | ICD-10-CM | POA: Diagnosis present

## 2015-06-04 DIAGNOSIS — Z7952 Long term (current) use of systemic steroids: Secondary | ICD-10-CM | POA: Insufficient documentation

## 2015-06-04 DIAGNOSIS — Z91040 Latex allergy status: Secondary | ICD-10-CM | POA: Insufficient documentation

## 2015-06-04 DIAGNOSIS — Z87891 Personal history of nicotine dependence: Secondary | ICD-10-CM | POA: Diagnosis not present

## 2015-06-04 DIAGNOSIS — N200 Calculus of kidney: Secondary | ICD-10-CM | POA: Insufficient documentation

## 2015-06-04 DIAGNOSIS — Z9049 Acquired absence of other specified parts of digestive tract: Secondary | ICD-10-CM | POA: Insufficient documentation

## 2015-06-04 DIAGNOSIS — Z8719 Personal history of other diseases of the digestive system: Secondary | ICD-10-CM | POA: Insufficient documentation

## 2015-06-04 DIAGNOSIS — R197 Diarrhea, unspecified: Secondary | ICD-10-CM | POA: Insufficient documentation

## 2015-06-04 DIAGNOSIS — I10 Essential (primary) hypertension: Secondary | ICD-10-CM | POA: Diagnosis not present

## 2015-06-04 DIAGNOSIS — Z8639 Personal history of other endocrine, nutritional and metabolic disease: Secondary | ICD-10-CM | POA: Insufficient documentation

## 2015-06-04 LAB — COMPREHENSIVE METABOLIC PANEL
ALT: 9 U/L — AB (ref 14–54)
AST: 15 U/L (ref 15–41)
Albumin: 3.5 g/dL (ref 3.5–5.0)
Alkaline Phosphatase: 72 U/L (ref 38–126)
Anion gap: 10 (ref 5–15)
BUN: 6 mg/dL (ref 6–20)
CHLORIDE: 108 mmol/L (ref 101–111)
CO2: 22 mmol/L (ref 22–32)
Calcium: 8.9 mg/dL (ref 8.9–10.3)
Creatinine, Ser: 0.79 mg/dL (ref 0.44–1.00)
GFR calc non Af Amer: >60 mL/min (ref >60)
Glucose, Bld: 116 mg/dL — ABNORMAL HIGH (ref 65–99)
Potassium: 3.4 mmol/L — ABNORMAL LOW (ref 3.5–5.1)
Sodium: 140 mmol/L (ref 135–145)
Total Bilirubin: 0.3 mg/dL (ref 0.3–1.2)
Total Protein: 7.2 g/dL (ref 6.5–8.1)

## 2015-06-04 LAB — CBC WITH DIFFERENTIAL/PLATELET
BASOS ABS: 0 10*3/uL (ref 0.0–0.1)
Basophils Relative: 0 % (ref 0–1)
EOS PCT: 1 % (ref 0–5)
Eosinophils Absolute: 0.1 10*3/uL (ref 0.0–0.7)
HCT: 32.7 % — ABNORMAL LOW (ref 36.0–46.0)
Hemoglobin: 10.3 g/dL — ABNORMAL LOW (ref 12.0–15.0)
LYMPHS ABS: 0.9 10*3/uL (ref 0.7–4.0)
Lymphocytes Relative: 11 % — ABNORMAL LOW (ref 12–46)
MCH: 24 pg — ABNORMAL LOW (ref 26.0–34.0)
MCHC: 31.5 g/dL (ref 30.0–36.0)
MCV: 76.2 fL — ABNORMAL LOW (ref 78.0–100.0)
Monocytes Absolute: 0.5 10*3/uL (ref 0.1–1.0)
Monocytes Relative: 5 % (ref 3–12)
NEUTROS ABS: 7.3 10*3/uL (ref 1.7–7.7)
NEUTROS PCT: 83 % — AB (ref 43–77)
Platelets: 272 10*3/uL (ref 150–400)
RBC: 4.29 MIL/uL (ref 3.87–5.11)
RDW: 13.5 % (ref 11.5–15.5)
WBC: 8.9 10*3/uL (ref 4.0–10.5)

## 2015-06-04 LAB — I-STAT CG4 LACTIC ACID, ED: LACTIC ACID, VENOUS: 1.85 mmol/L (ref 0.5–2.0)

## 2015-06-04 LAB — LIPASE, BLOOD: Lipase: 209 U/L — ABNORMAL HIGH (ref 22–51)

## 2015-06-04 LAB — I-STAT BETA HCG BLOOD, ED (MC, WL, AP ONLY)

## 2015-06-04 MED ORDER — IOHEXOL 300 MG/ML  SOLN
25.0000 mL | Freq: Once | INTRAMUSCULAR | Status: AC | PRN
  Administered 2015-06-04: 25 mL via ORAL

## 2015-06-04 MED ORDER — SODIUM CHLORIDE 0.9 % IV BOLUS (SEPSIS)
500.0000 mL | Freq: Once | INTRAVENOUS | Status: AC
  Administered 2015-06-04: 500 mL via INTRAVENOUS

## 2015-06-04 MED ORDER — HYDROMORPHONE HCL 1 MG/ML IJ SOLN
0.5000 mg | Freq: Once | INTRAMUSCULAR | Status: AC
  Administered 2015-06-04: 0.5 mg via INTRAVENOUS
  Filled 2015-06-04: qty 1

## 2015-06-04 MED ORDER — MORPHINE SULFATE 4 MG/ML IJ SOLN
6.0000 mg | Freq: Once | INTRAMUSCULAR | Status: AC
  Administered 2015-06-04: 6 mg via INTRAVENOUS
  Filled 2015-06-04: qty 2

## 2015-06-04 MED ORDER — IOHEXOL 300 MG/ML  SOLN
100.0000 mL | Freq: Once | INTRAMUSCULAR | Status: AC | PRN
  Administered 2015-06-04: 100 mL via INTRAVENOUS

## 2015-06-04 NOTE — ED Provider Notes (Signed)
CSN: [id number]     Arrival date & time 06/04/15  2138 History   First MD Initiated Contact with Patient 06/04/15 2153     Chief Complaint  Patient presents with  . Abdominal Pain     (Consider location/radiation/quality/duration/timing/severity/associated sxs/prior Treatment) Patient is a 34 y.o. female presenting with abdominal pain. The history is provided by the patient. No language interpreter was used.  Abdominal Pain Pain location:  Generalized (right greater than left) Pain quality: cramping and sharp   Pain radiates to:  Does not radiate Pain severity:  Severe Onset quality:  Sudden Timing:  Constant Associated symptoms: diarrhea, nausea and vomiting   Associated symptoms: no chest pain, no chills, no dysuria, no fever and no shortness of breath   Associated symptoms comment:  Patient with a history of ulcerative colitis with recent total colectomy (12/2014) and colostomy reversal 04/29/15 at [facility]. She has had persistent pain postoperatively that was controlled with her prescribed pain medication. Tonight, the pain became sharply and suddenly worse that was not relieved with narcotic pain relievers. She has nausea with vomiting. No hematemesis. She has had multiple non-bloody bowel movements today as well, also without bleeding. No SOB, fever, cough or urinary symptoms.   Past Medical History  Diagnosis Date  . Irregular heart beat   . Ulcerative colitis   . PCOS (polycystic ovarian syndrome)   . Hypertension    Past Surgical History  Procedure Laterality Date  . Wisdom tooth extraction    . Colectomy     Family History  Problem Relation Age of Onset  . Anesthesia problems Neg Hx    History  Substance Use Topics  . Smoking status: Former Smoker    Quit date: 11/28/2011  . Smokeless tobacco: Never Used  . Alcohol Use: Yes     Comment: occas., once a month or less.   OB History    Gravida Para Term Preterm AB TAB SAB Ectopic Multiple Living   0               Review of Systems  Constitutional: Negative for fever and chills.  Respiratory: Negative.  Negative for shortness of breath.   Cardiovascular: Negative.  Negative for chest pain.  Gastrointestinal: Positive for nausea, vomiting, abdominal pain and diarrhea.  Genitourinary: Negative.  Negative for dysuria.  Musculoskeletal: Negative.  Negative for myalgias and back pain.  Skin: Negative.   Neurological: Negative.       Allergies  Chocolate; Latex; and Nickel  Home Medications   Prior to Admission medications   Medication Sig Start Date End Date Taking? Authorizing Provider  acetaminophen (TYLENOL) 500 MG tablet Take 500 mg by mouth every 6 (six) hours as needed for mild pain.    Historical Provider, MD  cetirizine (ZYRTEC) 10 MG tablet Take 10 mg by mouth daily as needed for allergies.     Historical Provider, MD  diazepam (VALIUM) 5 MG tablet Take 1 tablet (5 mg total) by mouth every 12 (twelve) hours as needed for muscle spasms. Patient not taking: Reported on 01/17/2015 10/26/14   Antony Madura, PA-C  escitalopram (LEXAPRO) 20 MG tablet Take 20 mg by mouth at bedtime.     Historical Provider, MD  HYDROcodone-acetaminophen (NORCO/VICODIN) 5-325 MG per tablet Take 1-2 tablets by mouth every 6 (six) hours as needed for moderate pain or severe pain. Patient not taking: Reported on 01/17/2015 10/26/14   Antony Madura, PA-C  ibuprofen (ADVIL,MOTRIN) 200 MG tablet Take 200 mg by mouth every 6 (  six) hours as needed for moderate pain.    Historical Provider, MD  ibuprofen (ADVIL,MOTRIN) 800 MG tablet Take 1 tablet (800 mg total) by mouth 3 (three) times daily. 02/04/15   April Palumbo, MD  ketorolac (TORADOL) 10 MG tablet Take 1 tablet (10 mg total) by mouth every 6 (six) hours as needed for moderate pain. Patient not taking: Reported on 02/03/2015 01/17/15   Elwin Mocha, MD  loratadine (CLARITIN) 10 MG tablet Take 10 mg by mouth daily as needed for allergies.     Historical Provider, MD   mercaptopurine (PURINETHOL) 50 MG tablet Take 2 tablets (100 mg total) by mouth daily. Give on an empty stomach 1 hour before or 2 hours after meals. Caution: Chemotherapy. Patient not taking: Reported on 01/17/2015 12/03/14   Marinda Elk, MD  Mesalamine (ASACOL) 400 MG CPDR DR capsule Take 2 capsules (800 mg total) by mouth 3 (three) times daily. Patient not taking: Reported on 01/17/2015 12/03/14   Marinda Elk, MD  methylPREDNISolone sodium succinate (SOLU-MEDROL) 125 mg/2 mL injection Inject 0.96 mLs (60 mg total) into the vein every 6 (six) hours. Patient not taking: Reported on 01/17/2015 12/03/14   Marinda Elk, MD  Norgestimate-Ethinyl Estradiol Triphasic (TRINESSA, 28,) 0.18/0.215/0.25 MG-35 MCG tablet Take 1 tablet by mouth at bedtime.    Historical Provider, MD  ondansetron (ZOFRAN ODT) 4 MG disintegrating tablet Take 1 tablet (4 mg total) by mouth every 4 (four) hours as needed for nausea or vomiting. Patient not taking: Reported on 01/17/2015 08/29/14   Arby Barrette, MD  pantoprazole (PROTONIX) 40 MG tablet Take 40 mg by mouth at bedtime.     Historical Provider, MD  predniSONE (DELTASONE) 5 MG tablet Take 2.5 mg by mouth daily with breakfast.     Historical Provider, MD   BP 148/129 mmHg  Pulse 68  Temp(Src) 97.8 F (36.6 C) (Oral)  Resp 24  Ht 5\' 3"  (1.6 m)  Wt 171 lb (77.565 kg)  BMI 30.30 kg/m2  SpO2 100%  LMP 05/19/2015 (Approximate) Physical Exam  Constitutional: She is oriented to person, place, and time. She appears well-developed and well-nourished.  Uncomfortable appearing.  HENT:  Head: Normocephalic.  Neck: Normal range of motion. Neck supple.  Cardiovascular: Normal rate and regular rhythm.   Pulmonary/Chest: Effort normal and breath sounds normal.  Abdominal: Soft. Bowel sounds are normal. There is tenderness. There is no rebound and no guarding.  Tender to bilateral lower quadrants, worse on the right. Soft abdomen. Multiple well healed  surgical scars anterior abdomen.   Musculoskeletal: Normal range of motion.  Neurological: She is alert and oriented to person, place, and time.  Skin: Skin is warm and dry. No rash noted.  Psychiatric: She has a normal mood and affect.    ED Course  Procedures (including critical care time) Labs Review Labs Reviewed  CBC WITH DIFFERENTIAL/PLATELET  COMPREHENSIVE METABOLIC PANEL  LIPASE, BLOOD  I-STAT CG4 LACTIC ACID, ED  I-STAT BETA HCG BLOOD, ED ([facility], [facility], [facility] ONLY)   Results for orders placed or performed during the hospital encounter of 06/04/15  CBC with Differential  Result Value Ref Range   WBC 8.9 4.0 - 10.5 K/uL   RBC 4.29 3.87 - 5.11 MIL/uL   Hemoglobin 10.3 (L) 12.0 - 15.0 g/dL   HCT 16.1 (L) 09.6 - 04.5 %   MCV 76.2 (L) 78.0 - 100.0 fL   MCH 24.0 (L) 26.0 - 34.0 pg   MCHC 31.5 30.0 - 36.0 g/dL  RDW 13.5 11.5 - 15.5 %   Platelets 272 150 - 400 K/uL   Neutrophils Relative % 83 (H) 43 - 77 %   Neutro Abs 7.3 1.7 - 7.7 K/uL   Lymphocytes Relative 11 (L) 12 - 46 %   Lymphs Abs 0.9 0.7 - 4.0 K/uL   Monocytes Relative 5 3 - 12 %   Monocytes Absolute 0.5 0.1 - 1.0 K/uL   Eosinophils Relative 1 0 - 5 %   Eosinophils Absolute 0.1 0.0 - 0.7 K/uL   Basophils Relative 0 0 - 1 %   Basophils Absolute 0.0 0.0 - 0.1 K/uL  Comprehensive metabolic panel  Result Value Ref Range   Sodium 140 135 - 145 mmol/L   Potassium 3.4 (L) 3.5 - 5.1 mmol/L   Chloride 108 101 - 111 mmol/L   CO2 22 22 - 32 mmol/L   Glucose, Bld 116 (H) 65 - 99 mg/dL   BUN 6 6 - 20 mg/dL   Creatinine, Ser 1.30 0.44 - 1.00 mg/dL   Calcium 8.9 8.9 - 86.5 mg/dL   Total Protein 7.2 6.5 - 8.1 g/dL   Albumin 3.5 3.5 - 5.0 g/dL   AST 15 15 - 41 U/L   ALT 9 (L) 14 - 54 U/L   Alkaline Phosphatase 72 38 - 126 U/L   Total Bilirubin 0.3 0.3 - 1.2 mg/dL   GFR calc non Af Amer >60 >60 mL/min   GFR calc Af Amer >60 >60 mL/min   Anion gap 10 5 - 15  Lipase, blood  Result Value Ref Range   Lipase 209 (H) 22 - 51 U/L   I-Stat CG4 Lactic Acid, ED  Result Value Ref Range   Lactic Acid, Venous 1.85 0.5 - 2.0 mmol/L  I-Stat beta hCG blood, ED  Result Value Ref Range   I-stat hCG, quantitative <5.0 <5 mIU/mL   Comment 3           Ct Abdomen Pelvis W Contrast  06/05/2015   CLINICAL DATA:  RIGHT lower quadrant pain, status post intestinal surgery June 2nd. Pain beginning at 1900 hours today. History of ulcerative colitis, polycystic ovarian disease.  EXAM: CT ABDOMEN AND PELVIS WITH CONTRAST  TECHNIQUE: Multidetector CT imaging of the abdomen and pelvis was performed using the standard protocol following bolus administration of intravenous contrast.  CONTRAST:  25mL OMNIPAQUE IOHEXOL 300 MG/ML SOLN, OMNIPAQUE IOHEXOL 300 MG/ML SOLN  COMPARISON: CT abdomen and pelvis November 23, 2014  FINDINGS: LUNG BASES: Included view of the lung bases are clear. Visualized heart and pericardium are unremarkable.  SOLID ORGANS: The liver demonstrates focal fatty infiltration about the falciform ligament, otherwise unremarkable. Spleen, gallbladder, pancreas and adrenal glands are unremarkable.  GASTROINTESTINAL TRACT: Status post colectomy, rectosigmoid bowel anastomosis with mild fat stranding. Normal appearance of the small bowel.  KIDNEYS/ URINARY TRACT: RIGHT kidney is enlarged, mildly delayed nephrogram. RIGHT neural the ileal thickening with mild RIGHT hydroureteronephrosis. 4 mm RIGHT ureterovesicular junction calculus. No LEFT hydronephrosis or nephrolithiasis. Urinary bladder is decompressed, unremarkable.  PERITONEUM/RETROPERITONEUM: Rounded focal inflammatory changes RIGHT lower quadrant with small lymph nodes, axial 59/91. Aortoiliac vessels are normal in course and caliber. No lymphadenopathy by CT size criteria. Internal reproductive organs are unremarkable. No intraperitoneal free fluid nor free air.  SOFT TISSUE/OSSEOUS STRUCTURES: Non-suspicious. Anterior abdominal wall scarring, port sites, low possible prior  ileostomy. Small calcified lymph nodes LEFT inguinal canal.  IMPRESSION: 4 mm RIGHT ureterovesicular junction calculus resulting in mild RIGHT hydroureteronephrosis. Superimposed suspected urinary  tract infection.  Inflammatory change in the RIGHT lower quadrant most consistent with epiploic appendagitis versus omental infarct.  Status post colectomy with mild inflammatory changes of the rectosigmoid junction which may be postoperative, though recurrent ulcerative colitis could also have this appearance.   Electronically Signed   By: Awilda Metroourtnay  Bloomer M.D.   On: 06/05/2015 00:19    Imaging Review No results found.   EKG Interpretation None      MDM   Final diagnoses:  None    1. Right ureteral stone  She has a complicated recent medical history including ulcerative colitis with total colectomy and colostomy reversal 6 weeks ago. Sudden sharp increase to pain tonight with vomiting. No fever. Will do CT abd/pel to evaluate for post-op abscess or other infection.  Patient's pain and nausea are well controlled. CT scan showing 4 mm right ureteral stone, ?UTI. UA pending. [facility] Plan: if UTI then admit for infected stone; if no UTI re-evaluate pain control and potentially discharge home.     Elpidio AnisShari Sheilia Reznick, PA-C 06/05/15 0106  Gilda Creasehristopher J Pollina, MD 06/05/15 1620

## 2015-06-04 NOTE — ED Notes (Signed)
Pt was initially tachycardic at 155 but when redone patient was 68 and regular.  Pt is hypotensive at this time.

## 2015-06-04 NOTE — ED Notes (Signed)
Pt arrived to the ED with a complaint of right lower quadrant abdominal pain.  Pt states that she has had intestinal surgery on June 2nd.  Pt began having pain today around 1900 hrs.  Pt was given 8 mg's of Zofran in route.  Pt states she has taken an oxycodone around 1800.  [facility] she be seen in an Emergency Room

## 2015-06-05 LAB — URINALYSIS, ROUTINE W REFLEX MICROSCOPIC
BILIRUBIN URINE: NEGATIVE
Glucose, UA: NEGATIVE mg/dL
KETONES UR: NEGATIVE mg/dL
Leukocytes, UA: NEGATIVE
Nitrite: NEGATIVE
PH: 5.5 (ref 5.0–8.0)
PROTEIN: NEGATIVE mg/dL
SPECIFIC GRAVITY, URINE: 1.046 — AB (ref 1.005–1.030)
UROBILINOGEN UA: 0.2 mg/dL (ref 0.0–1.0)

## 2015-06-05 LAB — URINE MICROSCOPIC-ADD ON

## 2015-06-05 MED ORDER — PROMETHAZINE HCL 25 MG PO TABS: 25.0000 mg | ORAL_TABLET | Freq: Four times a day (QID) | ORAL | Status: DC | PRN

## 2015-06-05 MED ORDER — HYDROMORPHONE HCL 1 MG/ML IJ SOLN
0.5000 mg | Freq: Once | INTRAMUSCULAR | Status: AC
  Administered 2015-06-05: 0.5 mg via INTRAVENOUS
  Filled 2015-06-05: qty 1

## 2015-06-05 NOTE — ED Notes (Signed)
Pt cannot use rest room at this time, aware urine sample is needed 

## 2015-06-05 NOTE — Discharge Instructions (Signed)

## 2015-06-05 NOTE — ED Provider Notes (Signed)
8119 - Patient care assumed from Elpidio Anis, PA-C at shift change. Plan discussed with Upstill, PA-C which includes discharge if urinalysis does not suggest infection. Findings reviewed and urinalysis with hematuria without pyuria. No indication for further workup. Symptoms and CT findings consistent with uncomplicated right ureterolithiasis. Patient given referral to urology. Return precautions also given. Patient discharged in good condition.   Results for orders placed or performed during the hospital encounter of 06/04/15  CBC with Differential  Result Value Ref Range   WBC 8.9 4.0 - 10.5 K/uL   RBC 4.29 3.87 - 5.11 MIL/uL   Hemoglobin 10.3 (L) 12.0 - 15.0 g/dL   HCT 14.7 (L) 82.9 - 56.2 %   MCV 76.2 (L) 78.0 - 100.0 fL   MCH 24.0 (L) 26.0 - 34.0 pg   MCHC 31.5 30.0 - 36.0 g/dL   RDW 13.0 86.5 - 78.4 %   Platelets 272 150 - 400 K/uL   Neutrophils Relative % 83 (H) 43 - 77 %   Neutro Abs 7.3 1.7 - 7.7 K/uL   Lymphocytes Relative 11 (L) 12 - 46 %   Lymphs Abs 0.9 0.7 - 4.0 K/uL   Monocytes Relative 5 3 - 12 %   Monocytes Absolute 0.5 0.1 - 1.0 K/uL   Eosinophils Relative 1 0 - 5 %   Eosinophils Absolute 0.1 0.0 - 0.7 K/uL   Basophils Relative 0 0 - 1 %   Basophils Absolute 0.0 0.0 - 0.1 K/uL  Comprehensive metabolic panel  Result Value Ref Range   Sodium 140 135 - 145 mmol/L   Potassium 3.4 (L) 3.5 - 5.1 mmol/L   Chloride 108 101 - 111 mmol/L   CO2 22 22 - 32 mmol/L   Glucose, Bld 116 (H) 65 - 99 mg/dL   BUN 6 6 - 20 mg/dL   Creatinine, Ser 6.96 0.44 - 1.00 mg/dL   Calcium 8.9 8.9 - 29.5 mg/dL   Total Protein 7.2 6.5 - 8.1 g/dL   Albumin 3.5 3.5 - 5.0 g/dL   AST 15 15 - 41 U/L   ALT 9 (L) 14 - 54 U/L   Alkaline Phosphatase 72 38 - 126 U/L   Total Bilirubin 0.3 0.3 - 1.2 mg/dL   GFR calc non Af Amer >60 >60 mL/min   GFR calc Af Amer >60 >60 mL/min   Anion gap 10 5 - 15  Lipase, blood  Result Value Ref Range   Lipase 209 (H) 22 - 51 U/L  Urinalysis, Routine w reflex  microscopic (not at [facility])  Result Value Ref Range   Color, Urine YELLOW YELLOW   APPearance CLEAR CLEAR   Specific Gravity, Urine 1.046 (H) 1.005 - 1.030   pH 5.5 5.0 - 8.0   Glucose, UA NEGATIVE NEGATIVE mg/dL   Hgb urine dipstick MODERATE (A) NEGATIVE   Bilirubin Urine NEGATIVE NEGATIVE   Ketones, ur NEGATIVE NEGATIVE mg/dL   Protein, ur NEGATIVE NEGATIVE mg/dL   Urobilinogen, UA 0.2 0.0 - 1.0 mg/dL   Nitrite NEGATIVE NEGATIVE   Leukocytes, UA NEGATIVE NEGATIVE  Urine microscopic-add on  Result Value Ref Range   Squamous Epithelial / LPF RARE RARE   RBC / HPF 11-20 <3 RBC/hpf  I-Stat CG4 Lactic Acid, ED  Result Value Ref Range   Lactic Acid, Venous 1.85 0.5 - 2.0 mmol/L  I-Stat beta hCG blood, ED  Result Value Ref Range   I-stat hCG, quantitative <5.0 <5 mIU/mL   Comment 3  Ct Abdomen Pelvis W Contrast  06/05/2015   CLINICAL DATA:  RIGHT lower quadrant pain, status post intestinal surgery June 2nd. Pain beginning at 1900 hours today. History of ulcerative colitis, polycystic ovarian disease.  EXAM: CT ABDOMEN AND PELVIS WITH CONTRAST  TECHNIQUE: Multidetector CT imaging of the abdomen and pelvis was performed using the standard protocol following bolus administration of intravenous contrast.  CONTRAST:  25mL OMNIPAQUE IOHEXOL 300 MG/ML SOLN, 100mL OMNIPAQUE IOHEXOL 300 MG/ML SOLN  COMPARISON: CT abdomen and pelvis November 23, 2014  FINDINGS: LUNG BASES: Included view of the lung bases are clear. Visualized heart and pericardium are unremarkable.  SOLID ORGANS: The liver demonstrates focal fatty infiltration about the falciform ligament, otherwise unremarkable. Spleen, gallbladder, pancreas and adrenal glands are unremarkable.  GASTROINTESTINAL TRACT: Status post colectomy, rectosigmoid bowel anastomosis with mild fat stranding. Normal appearance of the small bowel.  KIDNEYS/ URINARY TRACT: RIGHT kidney is enlarged, mildly delayed nephrogram. RIGHT neural the ileal thickening  with mild RIGHT hydroureteronephrosis. 4 mm RIGHT ureterovesicular junction calculus. No LEFT hydronephrosis or nephrolithiasis. Urinary bladder is decompressed, unremarkable.  PERITONEUM/RETROPERITONEUM: Rounded focal inflammatory changes RIGHT lower quadrant with small lymph nodes, axial 59/91. Aortoiliac vessels are normal in course and caliber. No lymphadenopathy by CT size criteria. Internal reproductive organs are unremarkable. No intraperitoneal free fluid nor free air.  SOFT TISSUE/OSSEOUS STRUCTURES: Non-suspicious. Anterior abdominal wall scarring, port sites, low possible prior ileostomy. Small calcified lymph nodes LEFT inguinal canal.  IMPRESSION: 4 mm RIGHT ureterovesicular junction calculus resulting in mild RIGHT hydroureteronephrosis. Superimposed suspected urinary tract infection.  Inflammatory change in the RIGHT lower quadrant most consistent with epiploic appendagitis versus omental infarct.  Status post colectomy with mild inflammatory changes of the rectosigmoid junction which may be postoperative, though recurrent ulcerative colitis could also have this appearance.   Electronically Signed   By: Awilda Metroourtnay  Bloomer M.D.   On: 06/05/2015 00:19      Antony MaduraKelly Kendel Pesnell, PA-C 06/05/15 0230  Marisa Severinlga Otter, MD 06/05/15 [phone]

## 2015-06-06 LAB — URINE CULTURE: CULTURE: NO GROWTH

## 2015-06-24 ENCOUNTER — Emergency Department (HOSPITAL_COMMUNITY): Payer: No Typology Code available for payment source

## 2015-06-24 ENCOUNTER — Emergency Department (HOSPITAL_COMMUNITY)
Admission: EM | Admit: 2015-06-24 | Discharge: 2015-06-24 | Disposition: A | Discharge status: Home / Self Care | Payer: No Typology Code available for payment source | Attending: Emergency Medicine | Admitting: Emergency Medicine

## 2015-06-24 ENCOUNTER — Encounter (HOSPITAL_COMMUNITY): Payer: Self-pay

## 2015-06-24 DIAGNOSIS — R61 Generalized hyperhidrosis: Secondary | ICD-10-CM | POA: Diagnosis not present

## 2015-06-24 DIAGNOSIS — I10 Essential (primary) hypertension: Secondary | ICD-10-CM | POA: Diagnosis not present

## 2015-06-24 DIAGNOSIS — R140 Abdominal distension (gaseous): Secondary | ICD-10-CM | POA: Insufficient documentation

## 2015-06-24 DIAGNOSIS — Z8742 Personal history of other diseases of the female genital tract: Secondary | ICD-10-CM | POA: Insufficient documentation

## 2015-06-24 DIAGNOSIS — Z3202 Encounter for pregnancy test, result negative: Secondary | ICD-10-CM | POA: Diagnosis not present

## 2015-06-24 DIAGNOSIS — Z91040 Latex allergy status: Secondary | ICD-10-CM | POA: Diagnosis not present

## 2015-06-24 DIAGNOSIS — Z79899 Other long term (current) drug therapy: Secondary | ICD-10-CM | POA: Insufficient documentation

## 2015-06-24 DIAGNOSIS — R1031 Right lower quadrant pain: Secondary | ICD-10-CM | POA: Diagnosis present

## 2015-06-24 DIAGNOSIS — K5190 Ulcerative colitis, unspecified, without complications: Secondary | ICD-10-CM | POA: Diagnosis not present

## 2015-06-24 DIAGNOSIS — R197 Diarrhea, unspecified: Secondary | ICD-10-CM | POA: Diagnosis not present

## 2015-06-24 DIAGNOSIS — R102 Pelvic and perineal pain: Secondary | ICD-10-CM | POA: Insufficient documentation

## 2015-06-24 DIAGNOSIS — Z791 Long term (current) use of non-steroidal anti-inflammatories (NSAID): Secondary | ICD-10-CM | POA: Diagnosis not present

## 2015-06-24 DIAGNOSIS — Z87891 Personal history of nicotine dependence: Secondary | ICD-10-CM | POA: Insufficient documentation

## 2015-06-24 LAB — URINALYSIS, ROUTINE W REFLEX MICROSCOPIC
BILIRUBIN URINE: NEGATIVE
GLUCOSE, UA: NEGATIVE mg/dL
Ketones, ur: NEGATIVE mg/dL
LEUKOCYTES UA: NEGATIVE
Nitrite: NEGATIVE
PH: 7 (ref 5.0–8.0)
Protein, ur: 30 mg/dL — AB
SPECIFIC GRAVITY, URINE: 1.016 (ref 1.005–1.030)
UROBILINOGEN UA: 0.2 mg/dL (ref 0.0–1.0)

## 2015-06-24 LAB — WET PREP, GENITAL
CLUE CELLS WET PREP: NONE SEEN
Trich, Wet Prep: NONE SEEN
YEAST WET PREP: NONE SEEN

## 2015-06-24 LAB — COMPREHENSIVE METABOLIC PANEL
ALT: 13 U/L — ABNORMAL LOW (ref 14–54)
ANION GAP: 9 (ref 5–15)
AST: 29 U/L (ref 15–41)
Albumin: 3.6 g/dL (ref 3.5–5.0)
Alkaline Phosphatase: 72 U/L (ref 38–126)
BUN: 7 mg/dL (ref 6–20)
CHLORIDE: 109 mmol/L (ref 101–111)
CO2: 21 mmol/L — ABNORMAL LOW (ref 22–32)
Calcium: 9.2 mg/dL (ref 8.9–10.3)
Creatinine, Ser: 0.95 mg/dL (ref 0.44–1.00)
GFR calc non Af Amer: >60 mL/min (ref >60)
Glucose, Bld: 177 mg/dL — ABNORMAL HIGH (ref 65–99)
POTASSIUM: 3.8 mmol/L (ref 3.5–5.1)
Sodium: 139 mmol/L (ref 135–145)
TOTAL PROTEIN: 7.1 g/dL (ref 6.5–8.1)
Total Bilirubin: 0.2 mg/dL — ABNORMAL LOW (ref 0.3–1.2)

## 2015-06-24 LAB — URINE MICROSCOPIC-ADD ON

## 2015-06-24 LAB — CBC
HCT: 35.6 % — ABNORMAL LOW (ref 36.0–46.0)
Hemoglobin: 11.3 g/dL — ABNORMAL LOW (ref 12.0–15.0)
MCH: 24.4 pg — ABNORMAL LOW (ref 26.0–34.0)
MCHC: 31.7 g/dL (ref 30.0–36.0)
MCV: 76.7 fL — AB (ref 78.0–100.0)
Platelets: 265 10*3/uL (ref 150–400)
RBC: 4.64 MIL/uL (ref 3.87–5.11)
RDW: 14.3 % (ref 11.5–15.5)
WBC: 6.7 10*3/uL (ref 4.0–10.5)

## 2015-06-24 LAB — PREGNANCY, URINE: Preg Test, Ur: NEGATIVE

## 2015-06-24 LAB — LIPASE, BLOOD: LIPASE: 211 U/L — AB (ref 22–51)

## 2015-06-24 MED ORDER — HYDROMORPHONE HCL 1 MG/ML IJ SOLN
0.5000 mg | Freq: Once | INTRAMUSCULAR | Status: AC
  Administered 2015-06-24: 0.5 mg via INTRAVENOUS
  Filled 2015-06-24: qty 1

## 2015-06-24 MED ORDER — ONDANSETRON 4 MG PO TBDP
4.0000 mg | ORAL_TABLET | Freq: Once | ORAL | Status: AC | PRN
  Administered 2015-06-24: 4 mg via ORAL

## 2015-06-24 MED ORDER — OXYCODONE-ACETAMINOPHEN 5-325 MG PO TABS
ORAL_TABLET | ORAL | Status: AC
  Filled 2015-06-24: qty 1

## 2015-06-24 MED ORDER — SODIUM CHLORIDE 0.9 % IV BOLUS (SEPSIS)
1000.0000 mL | Freq: Once | INTRAVENOUS | Status: AC
  Administered 2015-06-24: 1000 mL via INTRAVENOUS

## 2015-06-24 MED ORDER — IOHEXOL 300 MG/ML  SOLN
100.0000 mL | Freq: Once | INTRAMUSCULAR | Status: AC | PRN
  Administered 2015-06-24: 100 mL via INTRAVENOUS

## 2015-06-24 MED ORDER — OXYCODONE-ACETAMINOPHEN 5-325 MG PO TABS
1.0000 | ORAL_TABLET | Freq: Once | ORAL | Status: AC
  Administered 2015-06-24: 1 via ORAL

## 2015-06-24 MED ORDER — ONDANSETRON 4 MG PO TBDP
ORAL_TABLET | ORAL | Status: AC
  Filled 2015-06-24: qty 1

## 2015-06-24 NOTE — Discharge Instructions (Signed)
Abdominal Pain, Women °Abdominal (stomach, pelvic, or belly) pain can be caused by many things. It is important to tell your doctor: °· The location of the pain. °· Does it come and go or is it present all the time? °· Are there things that start the pain (eating certain foods, exercise)? °· Are there other symptoms associated with the pain (fever, nausea, vomiting, diarrhea)? °All of this is helpful to know when trying to find the cause of the pain. °CAUSES  °· Stomach: virus or bacteria infection, or ulcer. °· Intestine: appendicitis (inflamed appendix), regional ileitis (Crohn's disease), ulcerative colitis (inflamed colon), irritable bowel syndrome, diverticulitis (inflamed diverticulum of the colon), or cancer of the stomach or intestine. °· Gallbladder disease or stones in the gallbladder. °· Kidney disease, kidney stones, or infection. °· Pancreas infection or cancer. °· Fibromyalgia (pain disorder). °· Diseases of the female organs: °¨ Uterus: fibroid (non-cancerous) tumors or infection. °¨ Fallopian tubes: infection or tubal pregnancy. °¨ Ovary: cysts or tumors. °¨ Pelvic adhesions (scar tissue). °¨ Endometriosis (uterus lining tissue growing in the pelvis and on the pelvic organs). °¨ Pelvic congestion syndrome (female organs filling up with blood just before the menstrual period). °¨ Pain with the menstrual period. °¨ Pain with ovulation (producing an egg). °¨ Pain with an IUD (intrauterine device, birth control) in the uterus. °¨ Cancer of the female organs. °· Functional pain (pain not caused by a disease, may improve without treatment). °· Psychological pain. °· Depression. °DIAGNOSIS  °Your doctor will decide the seriousness of your pain by doing an examination. °· Blood tests. °· X-rays. °· Ultrasound. °· CT scan (computed tomography, special type of X-ray). °· MRI (magnetic resonance imaging). °· Cultures, for infection. °· Barium enema (dye inserted in the large intestine, to better view it with  X-rays). °· Colonoscopy (looking in intestine with a lighted tube). °· Laparoscopy (minor surgery, looking in abdomen with a lighted tube). °· Major abdominal exploratory surgery (looking in abdomen with a large incision). °TREATMENT  °The treatment will depend on the cause of the pain.  °· Many cases can be observed and treated at home. °· Over-the-counter medicines recommended by your caregiver. °· Prescription medicine. °· Antibiotics, for infection. °· Birth control pills, for painful periods or for ovulation pain. °· Hormone treatment, for endometriosis. °· Nerve blocking injections. °· Physical therapy. °· Antidepressants. °· Counseling with a psychologist or psychiatrist. °· Minor or major surgery. °HOME CARE INSTRUCTIONS  °· Do not take laxatives, unless directed by your caregiver. °· Take over-the-counter pain medicine only if ordered by your caregiver. Do not take aspirin because it can cause an upset stomach or bleeding. °· Try a clear liquid diet (broth or water) as ordered by your caregiver. Slowly move to a bland diet, as tolerated, if the pain is related to the stomach or intestine. °· Have a thermometer and take your temperature several times a day, and record it. °· Bed rest and sleep, if it helps the pain. °· Avoid sexual intercourse, if it causes pain. °· Avoid stressful situations. °· Keep your follow-up appointments and tests, as your caregiver orders. °· If the pain does not go away with medicine or surgery, you may try: °¨ Acupuncture. °¨ Relaxation exercises (yoga, meditation). °¨ Group therapy. °¨ Counseling. °SEEK MEDICAL CARE IF:  °· You notice certain foods cause stomach pain. °· Your home care treatment is not helping your pain. °· You need stronger pain medicine. °· You want your IUD removed. °· You feel faint or   lightheaded. °· You develop nausea and vomiting. °· You develop a rash. °· You are having side effects or an allergy to your medicine. °SEEK IMMEDIATE MEDICAL CARE IF:  °· Your  pain does not go away or gets worse. °· You have a fever. °· Your pain is felt only in portions of the abdomen. The right side could possibly be appendicitis. The left lower portion of the abdomen could be colitis or diverticulitis. °· You are passing blood in your stools (bright red or black tarry stools, with or without vomiting). °· You have blood in your urine. °· You develop chills, with or without a fever. °· You pass out. °MAKE SURE YOU:  °· Understand these instructions. °· Will watch your condition. °· Will get help right away if you are not doing well or get worse. °Document Released: 09/10/2007 Document Revised: 03/30/2014 Document Reviewed: 09/30/2009 °[employer]® Patient Information ©2015 [employer]. This information is not intended to replace advice given to you by your health care provider. Make sure you discuss any questions you have with your health care provider. ° °

## 2015-06-24 NOTE — ED Notes (Signed)
Pt presents with 2 week h/o R flank pain.  Pt has been seen at [facility] for same (had 4mm stone), at  [facility] for same and was in surgery to remove kidney stone today but pt reports surgeon did not see any stone.

## 2015-06-24 NOTE — ED Notes (Signed)
Called [facility]) for Dr.Riester. Waiting for Dr. To call back.

## 2015-06-24 NOTE — ED Provider Notes (Signed)
CSN: [id number]     Arrival date & time 06/24/15  1248 History   First MD Initiated Contact with Patient 06/24/15 1419     No chief complaint on file.  (Consider location/radiation/quality/duration/timing/severity/associated sxs/prior Treatment) HPI Patient is a 34 year old female with a history of pancreatitis, ulcerative colitis status post colectomy, PCOS, hypertension, possible nephrolithiasis status post cystoscopy today presenting for abdominal pain. Patient reports she has had right lower quadrant abdominal pain radiating to her back over the past several weeks. Diagnosed with nephrolithiasis and underwent cystoscopy today. Reports pain has been acutely increasing throughout the day that is uncontrollable with her home pain medications. Reports chronic diarrhea but no nausea or vomiting. Pain rated as a 12/10, with no alleviating factors and aggravated with palpation.  Reports current vaginal bleeding but normal menstration currently.  Denies any discharge, fevers, or chills.   Past Medical History  Diagnosis Date  . Irregular heart beat   . Ulcerative colitis   . PCOS (polycystic ovarian syndrome)   . Hypertension    Past Surgical History  Procedure Laterality Date  . Wisdom tooth extraction    . Colectomy     Family History  Problem Relation Age of Onset  . Anesthesia problems Neg Hx    History  Substance Use Topics  . Smoking status: Former Smoker    Quit date: 11/28/2011  . Smokeless tobacco: Never Used  . Alcohol Use: Yes     Comment: occas., once a month or less.   OB History    Gravida Para Term Preterm AB TAB SAB Ectopic Multiple Living   0              Review of Systems  Constitutional: Negative for fever and chills.  HENT: Negative for congestion and sore throat.   Eyes: Negative for pain.  Respiratory: Negative for cough and shortness of breath.   Cardiovascular: Negative for chest pain and palpitations.  Gastrointestinal: Positive for abdominal pain,  diarrhea and abdominal distention. Negative for nausea, vomiting, constipation, blood in stool, anal bleeding and rectal pain.  Genitourinary: Positive for flank pain. Negative for dysuria.  Musculoskeletal: Negative for back pain and neck pain.  Skin: Negative for rash.  Allergic/Immunologic: Negative.   Neurological: Negative for dizziness and light-headedness.  Psychiatric/Behavioral: Negative for confusion.    Allergies  Chocolate; Latex; and Nickel  Home Medications   Prior to Admission medications   Medication Sig Start Date End Date Taking? Authorizing Provider  acetaminophen (TYLENOL) 500 MG tablet Take 500 mg by mouth every 6 (six) hours as needed for mild pain.   Yes Historical Provider, MD  cetirizine (ZYRTEC) 10 MG tablet Take 10 mg by mouth daily as needed for allergies.    Yes Historical Provider, MD  escitalopram (LEXAPRO) 20 MG tablet Take 20 mg by mouth at bedtime.    Yes Historical Provider, MD  ibuprofen (ADVIL,MOTRIN) 800 MG tablet Take 1 tablet (800 mg total) by mouth 3 (three) times daily. 02/04/15  Yes April Palumbo, MD  loperamide (IMODIUM) 2 MG capsule Take 2 mg by mouth daily as needed for diarrhea or loose stools.   Yes Historical Provider, MD  loratadine (CLARITIN) 10 MG tablet Take 10 mg by mouth daily as needed for allergies.    Yes Historical Provider, MD  Norgestimate-Ethinyl Estradiol Triphasic (TRINESSA, 28,) 0.18/0.215/0.25 MG-35 MCG tablet Take 1 tablet by mouth at bedtime.   Yes Historical Provider, MD  oxyCODONE (OXY IR/ROXICODONE) 5 MG immediate release tablet Take 10 mg by mouth  every 4 (four) hours as needed for severe pain.    Yes Historical Provider, MD  pantoprazole (PROTONIX) 40 MG tablet Take 40 mg by mouth at bedtime.    Yes Historical Provider, MD  tamsulosin (FLOMAX) 0.4 MG CAPS capsule Take 0.4 mg by mouth daily.   Yes Historical Provider, MD  topiramate (TOPAMAX) 50 MG tablet Take 50 mg by mouth 2 (two) times daily.   Yes Historical  Provider, MD  diazepam (VALIUM) 5 MG tablet Take 1 tablet (5 mg total) by mouth every 12 (twelve) hours as needed for muscle spasms. Patient not taking: Reported on 01/17/2015 10/26/14   Antony Madura, PA-C  HYDROcodone-acetaminophen (NORCO/VICODIN) 5-325 MG per tablet Take 1-2 tablets by mouth every 6 (six) hours as needed for moderate pain or severe pain. Patient not taking: Reported on 01/17/2015 10/26/14   Antony Madura, PA-C  ketorolac (TORADOL) 10 MG tablet Take 1 tablet (10 mg total) by mouth every 6 (six) hours as needed for moderate pain. Patient not taking: Reported on 02/03/2015 01/17/15   Elwin Mocha, MD  mercaptopurine (PURINETHOL) 50 MG tablet Take 2 tablets (100 mg total) by mouth daily. Give on an empty stomach 1 hour before or 2 hours after meals. Caution: Chemotherapy. Patient not taking: Reported on 01/17/2015 12/03/14   Marinda Elk, MD  Mesalamine (ASACOL) 400 MG CPDR DR capsule Take 2 capsules (800 mg total) by mouth 3 (three) times daily. Patient not taking: Reported on 01/17/2015 12/03/14   Marinda Elk, MD  methylPREDNISolone sodium succinate (SOLU-MEDROL) 125 mg/2 mL injection Inject 0.96 mLs (60 mg total) into the vein every 6 (six) hours. Patient not taking: Reported on 01/17/2015 12/03/14   Marinda Elk, MD  ondansetron (ZOFRAN ODT) 4 MG disintegrating tablet Take 1 tablet (4 mg total) by mouth every 4 (four) hours as needed for nausea or vomiting. Patient not taking: Reported on 01/17/2015 08/29/14   Arby Barrette, MD  promethazine (PHENERGAN) 25 MG tablet Take 1 tablet (25 mg total) by mouth every 6 (six) hours as needed for nausea or vomiting. Patient not taking: Reported on 06/24/2015 06/05/15   Antony Madura, PA-C   BP 110/67 mmHg  Pulse 93  Temp(Src) 97.8 F (36.6 C) (Oral)  Resp 18  Ht 5\' 6"  (1.676 m)  Wt 168 lb 14.4 oz (76.613 kg)  BMI 27.27 kg/m2  SpO2 97%  LMP 06/23/2015 Physical Exam  Constitutional: She is oriented to person, place, and time. She  appears well-developed and well-nourished. She appears distressed.  HENT:  Head: Normocephalic and atraumatic.  Eyes: Conjunctivae and EOM are normal. Pupils are equal, round, and reactive to light.  Neck: Normal range of motion. Neck supple.  Cardiovascular: Normal rate, regular rhythm and normal heart sounds.   Pulmonary/Chest: Effort normal and breath sounds normal. No respiratory distress.  Abdominal: Soft. Bowel sounds are normal. She exhibits no mass. There is tenderness in the right lower quadrant. There is CVA tenderness (right) and tenderness at McBurney's point. There is no rigidity, no guarding and negative Murphy's sign.  Genitourinary: Vagina normal and uterus normal. Cervix exhibits no motion tenderness, no discharge and no friability. Right adnexum displays tenderness. Right adnexum displays no mass and no fullness. Left adnexum displays no mass, no tenderness and no fullness.  Musculoskeletal: Normal range of motion.  Neurological: She is alert and oriented to person, place, and time. She has normal reflexes. No cranial nerve deficit.  Skin: Skin is warm. She is diaphoretic.  Psychiatric: She has a  normal mood and affect.   ED Course  Procedures (including critical care time) Labs Review Labs Reviewed  WET PREP, GENITAL - Abnormal; Notable for the following:    WBC, Wet Prep HPF POC FEW (*)    All other components within normal limits  LIPASE, BLOOD - Abnormal; Notable for the following:    Lipase 211 (*)    All other components within normal limits  COMPREHENSIVE METABOLIC PANEL - Abnormal; Notable for the following:    CO2 21 (*)    Glucose, Bld 177 (*)    ALT 13 (*)    Total Bilirubin 0.2 (*)    All other components within normal limits  CBC - Abnormal; Notable for the following:    Hemoglobin 11.3 (*)    HCT 35.6 (*)    MCV 76.7 (*)    MCH 24.4 (*)    All other components within normal limits  URINALYSIS, ROUTINE W REFLEX MICROSCOPIC (NOT AT [facility]) - Abnormal;  Notable for the following:    Color, Urine RED (*)    APPearance CLOUDY (*)    Hgb urine dipstick LARGE (*)    Protein, ur 30 (*)    All other components within normal limits  URINE MICROSCOPIC-ADD ON  PREGNANCY, URINE  GC/CHLAMYDIA PROBE AMP ([employer]) NOT AT [facility]    Imaging Review US Transvaginal Non-ob  06/24/2015   CLINICAL DATA:  Right lower quadrant pain for 2 weeks  EXAM: TRANSABDOMINAL AND TRANSVAGINAL ULTRASOUND OF PELVIS  DOPPLER ULTRASOUND OF OVARIES  TECHNIQUE: Both transabdominal and transvaginal ultrasound examinations of the pelvis were performed. Transabdominal technique was performed for global imaging of the pelvis including uterus, ovaries, adnexal regions, and pelvic cul-de-sac.  It was necessary to proceed with endovaginal exam following the transabdominal exam to visualize the ovaries. Color and duplex Doppler ultrasound was utilized to evaluate blood flow to the ovaries.  COMPARISON:  None.  FINDINGS: Uterus  Measurements: 5.9 x 2.9 x 4.2 cm. No fibroids or other mass visualized.  Endometrium  Thickness: 2 mm.  No focal abnormality visualized.  Right ovary  Measurements: 2.5 x 1.7 x 2 cm. Normal appearance/no adnexal mass.  Left ovary  Measurements: 2.8 x 2.1 x 2.2 cm. Normal appearance/no adnexal mass.  Pulsed Doppler evaluation of both ovaries demonstrates normal low-resistance arterial and venous waveforms.  Other findings  Trace free fluid.  IMPRESSION: Normal pelvic ultrasound. Trace free fluid in the pelvis, likely physiologic.   Electronically Signed   By: Sherian Rein M.D.   On: 06/24/2015 20:39   US Pelvis Complete  06/24/2015   CLINICAL DATA:  Right lower quadrant pain for 2 weeks  EXAM: TRANSABDOMINAL AND TRANSVAGINAL ULTRASOUND OF PELVIS  DOPPLER ULTRASOUND OF OVARIES  TECHNIQUE: Both transabdominal and transvaginal ultrasound examinations of the pelvis were performed. Transabdominal technique was performed for global imaging of the pelvis including uterus,  ovaries, adnexal regions, and pelvic cul-de-sac.  It was necessary to proceed with endovaginal exam following the transabdominal exam to visualize the ovaries. Color and duplex Doppler ultrasound was utilized to evaluate blood flow to the ovaries.  COMPARISON:  None.  FINDINGS: Uterus  Measurements: 5.9 x 2.9 x 4.2 cm. No fibroids or other mass visualized.  Endometrium  Thickness: 2 mm.  No focal abnormality visualized.  Right ovary  Measurements: 2.5 x 1.7 x 2 cm. Normal appearance/no adnexal mass.  Left ovary  Measurements: 2.8 x 2.1 x 2.2 cm. Normal appearance/no adnexal mass.  Pulsed Doppler evaluation of both ovaries demonstrates  normal low-resistance arterial and venous waveforms.  Other findings  Trace free fluid.  IMPRESSION: Normal pelvic ultrasound. Trace free fluid in the pelvis, likely physiologic.   Electronically Signed   By: Sherian Rein M.D.   On: 06/24/2015 20:39   Ct Abdomen Pelvis W Contrast  06/24/2015   CLINICAL DATA:  Right lower quadrant pain after colon resection several weeks ago. Patient states she had a complete colectomy.  EXAM: CT ABDOMEN AND PELVIS WITH CONTRAST  TECHNIQUE: Multidetector CT imaging of the abdomen and pelvis was performed using the standard protocol following bolus administration of intravenous contrast.  CONTRAST:  OMNIPAQUE IOHEXOL 300 MG/ML  SOLN  COMPARISON:  June 04, 2015  FINDINGS: There is right hydronephrosis and right perinephric stranding. There is a focus air in the mid right ureter lumen.  There is focal fatty infiltration of liver near the falciform ligament. The liver is otherwise normal. The spleen, pancreas, gallbladder, adrenal glands are normal. The aorta is normal. There is no abdominal lymphadenopathy.  Patient status post prior colectomy. There is no small bowel obstruction. There is mesenteric stranding persistent in the right lower quadrant unchanged since prior CT. There is free fluid in the pelvis not significantly changed.  Fluid-filled  bladder with focus air in the bladder lumen is identified. The uterus is normal. It free fluid is identified in the pelvis. There is mild atelectasis of the posterior lung bases. The visualized bones are normal.  IMPRESSION: Right hydronephrosis and right perinephric stranding with focus air in the mid right ureter lumen.  The findings are worsened compared to prior exam. Findings are consistent with urinary tract infection.  The previously noted mesenteric stranding in the right lower quadrant is unchanged.  Free fluid is identified the pelvis unchanged.   Electronically Signed   By: Sherian Rein M.D.   On: 06/24/2015 16:55   [place] Art/ven Flow Abd Pelv Doppler Limited  06/24/2015   CLINICAL DATA:  Right lower quadrant pain for 2 weeks  EXAM: TRANSABDOMINAL AND TRANSVAGINAL ULTRASOUND OF PELVIS  DOPPLER ULTRASOUND OF OVARIES  TECHNIQUE: Both transabdominal and transvaginal ultrasound examinations of the pelvis were performed. Transabdominal technique was performed for global imaging of the pelvis including uterus, ovaries, adnexal regions, and pelvic cul-de-sac.  It was necessary to proceed with endovaginal exam following the transabdominal exam to visualize the ovaries. Color and duplex Doppler ultrasound was utilized to evaluate blood flow to the ovaries.  COMPARISON:  None.  FINDINGS: Uterus  Measurements: 5.9 x 2.9 x 4.2 cm. No fibroids or other mass visualized.  Endometrium  Thickness: 2 mm.  No focal abnormality visualized.  Right ovary  Measurements: 2.5 x 1.7 x 2 cm. Normal appearance/no adnexal mass.  Left ovary  Measurements: 2.8 x 2.1 x 2.2 cm. Normal appearance/no adnexal mass.  Pulsed Doppler evaluation of both ovaries demonstrates normal low-resistance arterial and venous waveforms.  Other findings  Trace free fluid.  IMPRESSION: Normal pelvic ultrasound. Trace free fluid in the pelvis, likely physiologic.   Electronically Signed   By: Sherian Rein M.D.   On: 06/24/2015 20:39   Dg Abd Acute  W/chest  06/24/2015   CLINICAL DATA:  Right-sided abdominal pain, history of pancreatitis  EXAM: DG ABDOMEN ACUTE W/ 1V CHEST  COMPARISON:  06/04/2015  FINDINGS: Cardiac shadow is within normal limits. The lungs are clear bilaterally. No focal infiltrate or sizable effusion is noted.  The stomach is distended with contrast material which may be related to an upcoming CT. No free  air is noted. Scattered large and small bowel gas is noted. No obstructive changes are seen no bony abnormality is noted.  IMPRESSION: Nonspecific chest and abdomen.  No acute abnormality is seen.   Electronically Signed   By: Alcide Clever M.D.   On: 06/24/2015 16:02     EKG Interpretation None      MDM   Final diagnoses:  Pelvic pain in female   Patient is a 34 year old female with a history of pancreatitis, ulcerative colitis status post colectomy, PCOS, hypertension, possible nephrolithiasis status post cystoscopy today presenting for abdominal pain.   DDX postoperative complication, urinary tract infection, pyelonephritis, cholecystitis, pancreatitis, bowel obstruction, ovarian cyst, ovarian torsion, TOA, PID.  On initial evaluation patient was hemodynamically stable but in moderate distress. She did have tenderness in her right lower quadrant and right flank with CVA tenderness. CT scan obtained showing right hydronephrosis and right perinephric stranding with a focus of air in the mid right ureter lumen. These findings were discussed with Dr. Reino Kent with [facility] urology. They feel this is normal findings status post cystoscopy. UA performed showing no signs of infection but large broad. They also feel that this should's normal findings. Patient afebrile with no tachycardia. No leukocytosis and doubt infectious etiology at this time.  Negative CT scan and cystoscopy this morning. Doubt nephrolithiasis.  Pelvic exam performed showing a right adnexal tenderness. Wet prep performed showing no abnormalities.  Transvaginal ultrasound also performed showing no acute abnormalities to indicate etiology of the pain. Doubt PID, TOA, torsion, cyst.  Lipase performed and elevated. Patient reports chronic pancreatitis and that her lipase was 400 several days ago. Reports that she is tolerating by mouth at this time but has moderate pain. No evidence of cholecystitis on CT scan.  Feel that this is most likely postoperative pain which was expressed to Dr.Hood who agrees. They advised that patient continues to have pain to make appointment for evaluation by urology in the morning. Discussed this with the patient and her family members. They feel comfortable returning home at this point with previous prescription of pain medications and nausea medications. Given red flag return precautions which the patient and family members understand.  If performed, labs, EKGs, and imaging were reviewed/interpreted by myself and my attending and incorporated into medical decision making.  Discussed pertinent finding with patient or caregiver prior to discharge with no further questions.  Immediate return precautions given and pt or caregiver reports understanding.  Pt care supervised by my attending Dr. Manus Gunning.   Tery Sanfilippo, MD PGY-2  Emergency Medicine    Tery Sanfilippo, MD 06/25/15 1610  Glynn Octave, MD 06/25/15 [phone]

## 2015-06-25 LAB — GC/CHLAMYDIA PROBE AMP (~~LOC~~) NOT AT ARMC
Chlamydia: NEGATIVE
NEISSERIA GONORRHEA: NEGATIVE

## 2015-09-30 ENCOUNTER — Telehealth: Payer: Self-pay | Admitting: Hematology

## 2015-09-30 NOTE — Telephone Encounter (Signed)
S/W PT IN REF TO NP APPT ON 10/25/15@10 :30 DX-ANEMIA REFERRING DR Abigail MiyamotoHACKER

## 2015-10-25 ENCOUNTER — Encounter: Payer: Self-pay | Admitting: Hematology

## 2015-10-25 ENCOUNTER — Telehealth: Payer: Self-pay | Admitting: Hematology

## 2015-10-25 ENCOUNTER — Ambulatory Visit (HOSPITAL_BASED_OUTPATIENT_CLINIC_OR_DEPARTMENT_OTHER): Payer: No Typology Code available for payment source | Admitting: Hematology

## 2015-10-25 ENCOUNTER — Ambulatory Visit (HOSPITAL_BASED_OUTPATIENT_CLINIC_OR_DEPARTMENT_OTHER): Payer: No Typology Code available for payment source

## 2015-10-25 VITALS — BP 117/80 | HR 98 | Temp 98.4°F | Resp 18 | Ht 66.0 in | Wt 167.5 lb

## 2015-10-25 DIAGNOSIS — K909 Intestinal malabsorption, unspecified: Secondary | ICD-10-CM

## 2015-10-25 DIAGNOSIS — D509 Iron deficiency anemia, unspecified: Secondary | ICD-10-CM | POA: Diagnosis not present

## 2015-10-25 DIAGNOSIS — K9089 Other intestinal malabsorption: Secondary | ICD-10-CM | POA: Diagnosis not present

## 2015-10-25 DIAGNOSIS — R5383 Other fatigue: Secondary | ICD-10-CM

## 2015-10-25 DIAGNOSIS — E538 Deficiency of other specified B group vitamins: Secondary | ICD-10-CM

## 2015-10-25 DIAGNOSIS — E559 Vitamin D deficiency, unspecified: Secondary | ICD-10-CM

## 2015-10-25 LAB — CBC & DIFF AND RETIC
BASO%: 0.7 % (ref 0.0–2.0)
BASOS ABS: 0.1 10*3/uL (ref 0.0–0.1)
EOS%: 6.9 % (ref 0.0–7.0)
Eosinophils Absolute: 0.5 10*3/uL (ref 0.0–0.5)
HEMATOCRIT: 38.5 % (ref 34.8–46.6)
HEMOGLOBIN: 12.8 g/dL (ref 11.6–15.9)
Immature Retic Fract: 4.9 % (ref 1.60–10.00)
LYMPH%: 27.3 % (ref 14.0–49.7)
MCH: 27.6 pg (ref 25.1–34.0)
MCHC: 33.2 g/dL (ref 31.5–36.0)
MCV: 83 fL (ref 79.5–101.0)
MONO#: 0.5 10*3/uL (ref 0.1–0.9)
MONO%: 7 % (ref 0.0–14.0)
NEUT%: 58.1 % (ref 38.4–76.8)
NEUTROS ABS: 4.1 10*3/uL (ref 1.5–6.5)
Platelets: 290 10*3/uL (ref 145–400)
RBC: 4.64 10*6/uL (ref 3.70–5.45)
RDW: 14.2 % (ref 11.2–14.5)
RETIC %: 1.56 % (ref 0.70–2.10)
Retic Ct Abs: 72.38 10*3/uL (ref 33.70–90.70)
WBC: 7.1 10*3/uL (ref 3.9–10.3)
lymph#: 2 10*3/uL (ref 0.9–3.3)

## 2015-10-25 LAB — COMPREHENSIVE METABOLIC PANEL (CC13)
ALBUMIN: 3.8 g/dL (ref 3.5–5.0)
ALK PHOS: 93 U/L (ref 40–150)
ALT: 17 U/L (ref 0–55)
AST: 18 U/L (ref 5–34)
Anion Gap: 9 mEq/L (ref 3–11)
BUN: 7.8 mg/dL (ref 7.0–26.0)
CALCIUM: 9.3 mg/dL (ref 8.4–10.4)
CHLORIDE: 107 meq/L (ref 98–109)
CO2: 25 mEq/L (ref 22–29)
Creatinine: 0.8 mg/dL (ref 0.6–1.1)
Glucose: 93 mg/dl (ref 70–140)
POTASSIUM: 3.4 meq/L — AB (ref 3.5–5.1)
Sodium: 142 mEq/L (ref 136–145)
Total Bilirubin: <0.3 mg/dL (ref 0.20–1.20)
Total Protein: 7.5 g/dL (ref 6.4–8.3)

## 2015-10-25 LAB — FERRITIN CHCC: Ferritin: 31 ng/ml (ref 9–269)

## 2015-10-25 LAB — IRON AND TIBC CHCC
%SAT: 19 % — AB (ref 21–57)
IRON: 75 ug/dL (ref 41–142)
TIBC: 389 ug/dL (ref 236–444)
UIBC: 314 ug/dL (ref 120–384)

## 2015-10-25 MED ORDER — B COMPLEX VITAMINS PO CAPS: 1.0000 | ORAL_CAPSULE | Freq: Every day | ORAL | Status: AC

## 2015-10-25 MED ORDER — B-12 1000 MCG SL SUBL: 1000.0000 ug | SUBLINGUAL_TABLET | Freq: Every day | SUBLINGUAL | Status: DC

## 2015-10-25 NOTE — Telephone Encounter (Signed)
per pof to sch pt appt-gave pt copy of avs-sent MW email to sch trmt-willc all pt after reply °

## 2015-10-26 LAB — VITAMIN B12: Vitamin B-12: 933 pg/mL — ABNORMAL HIGH (ref 211–911)

## 2015-10-26 LAB — VITAMIN D 25 HYDROXY (VIT D DEFICIENCY, FRACTURES): Vit D, 25-Hydroxy: 10 ng/mL — ABNORMAL LOW (ref 30–100)

## 2015-10-29 ENCOUNTER — Other Ambulatory Visit: Payer: No Typology Code available for payment source

## 2015-11-02 ENCOUNTER — Telehealth: Payer: Self-pay | Admitting: *Deleted

## 2015-11-02 NOTE — Telephone Encounter (Signed)
Patient called and left message regarding her iron appts. I have scheduled appt per 11/28 POF. I have called and left patient a message with appts.

## 2015-11-03 LAB — VITAMIN A: VITAMIN A (RETINOIC ACID): 57 ug/dL (ref 38–98)

## 2015-11-04 ENCOUNTER — Ambulatory Visit (HOSPITAL_BASED_OUTPATIENT_CLINIC_OR_DEPARTMENT_OTHER): Payer: No Typology Code available for payment source

## 2015-11-04 VITALS — BP 108/73 | HR 77 | Temp 98.4°F | Resp 16

## 2015-11-04 DIAGNOSIS — E538 Deficiency of other specified B group vitamins: Secondary | ICD-10-CM | POA: Diagnosis not present

## 2015-11-04 DIAGNOSIS — D509 Iron deficiency anemia, unspecified: Secondary | ICD-10-CM | POA: Diagnosis not present

## 2015-11-04 MED ORDER — CYANOCOBALAMIN 1000 MCG/ML IJ SOLN
INTRAMUSCULAR | Status: AC
  Filled 2015-11-04: qty 1

## 2015-11-04 MED ORDER — SODIUM CHLORIDE 0.9 % IV SOLN
510.0000 mg | Freq: Once | INTRAVENOUS | Status: AC
  Administered 2015-11-04: 510 mg via INTRAVENOUS
  Filled 2015-11-04: qty 17

## 2015-11-04 MED ORDER — CYANOCOBALAMIN 1000 MCG/ML IJ SOLN
1000.0000 ug | Freq: Once | INTRAMUSCULAR | Status: AC
  Administered 2015-11-04: 1000 ug via SUBCUTANEOUS

## 2015-11-04 MED ORDER — SODIUM CHLORIDE 0.9 % IV SOLN
Freq: Once | INTRAVENOUS | Status: AC
  Administered 2015-11-04: 09:00:00 via INTRAVENOUS

## 2015-11-04 NOTE — Patient Instructions (Signed)

## 2015-11-09 ENCOUNTER — Encounter: Payer: Self-pay | Admitting: Hematology

## 2015-11-09 DIAGNOSIS — E559 Vitamin D deficiency, unspecified: Secondary | ICD-10-CM | POA: Insufficient documentation

## 2015-11-09 MED ORDER — ERGOCALCIFEROL 1.25 MG (50000 UT) PO CAPS: 50000.0000 [IU] | ORAL_CAPSULE | ORAL | Status: DC

## 2015-11-09 NOTE — Progress Notes (Signed)
Marland Kitchen    HEMATOLOGY/ONCOLOGY CONSULTATION NOTE  Date of Service: 11/09/2015  Patient Care Team: Henrine Screws, MD as PCP - General (Family Medicine)  CHIEF COMPLAINTS/PURPOSE OF CONSULTATION:  Evaluation and management of Anemia.  HISTORY OF PRESENTING ILLNESS:   Kathleen Mcdonald is a wonderful 34 y.o. female who has been referred to [place] by Dr .Irving Copas, MD  for evaluation and management of anemia.  Patient has a h/o Ulcerative colitis (s/p multiple lines of treatment and then total colectomy for intractable disease), PCOS, OSA using CPAP, pancreatitis thought to be related to cholelithiasis.  She was noted to have microcytic anemia with hgb 11.1 MCV 76 with a very low ferritin of 6.6 with iron saturation and significant fatigue. She also notes some cognitive issues and was noted to have low B12 level of 198. She was on PO iron BID wth vit C with minimal improvement in her ron profile. She has been on chronic PPI and has we are unsure if she has had resection of her terminal ileum since this will additionally significantly affect b12 absorption. She notes that she is still feeling quite fatigued and wants to aggressively treated her Iron and B12 deficiency.  MEDICAL HISTORY:  Past Medical History  Diagnosis Date  . Irregular heart beat   . Ulcerative colitis (HCC)   . PCOS (polycystic ovarian syndrome)   . Hypertension   . Iatrogenic Cushing's disease (HCC)   . OSA (obstructive sleep apnea)     using CPAP  . Depression     using celexa  h/o Pacreatitis thought to be related to cholelithiasis.  SURGICAL HISTORY: Past Surgical History  Procedure Laterality Date  . Wisdom tooth extraction    . Colectomy  11/2014    for ulcerative colitis  . Colon repair  04/2015    SOCIAL HISTORY: Social History   Social History  . Marital Status: Single    Spouse Name: N/A  . Number of Children: N/A  . Years of Education: N/A   Occupational History  . Not on file.    Social History Main Topics  . Smoking status: Former Smoker    Quit date: 11/28/2011  . Smokeless tobacco: Never Used  . Alcohol Use: Yes     Comment: occas., once a month or less.  . Drug Use: No  . Sexual Activity: Yes     Comment: boyfriend has had a vasectomy   Other Topics Concern  . Not on file   Social History Narrative    FAMILY HISTORY: Family History  Problem Relation Age of Onset  . Anesthesia problems Neg Hx     ALLERGIES:  is allergic to chocolate; latex; and nickel.  MEDICATIONS:  Current Outpatient Prescriptions  Medication Sig Dispense Refill  . acetaminophen (TYLENOL) 500 MG tablet Take 500 mg by mouth every 6 (six) hours as needed for mild pain.    Marland Kitchen b complex vitamins capsule Take 1 capsule by mouth daily. 60 capsule 3  . buPROPion (WELLBUTRIN XL) 150 MG 24 hr tablet Take 150 mg by mouth.    . cetirizine (ZYRTEC) 10 MG tablet Take 10 mg by mouth daily as needed for allergies.     . Cyanocobalamin (B-12) 1000 MCG SUBL Place 1,000 mcg under the tongue daily. 30 each 3  . ferrous sulfate 325 (65 FE) MG EC tablet Take 325 mg by mouth.    Marland Kitchen HYDROcodone-acetaminophen (NORCO/VICODIN) 5-325 MG per tablet Take 1-2 tablets by mouth every 6 (six) hours as needed for  moderate pain or severe pain. (Patient not taking: Reported on 01/17/2015) 11 tablet 0  . loperamide (IMODIUM) 2 MG capsule Take 2 mg by mouth daily as needed for diarrhea or loose stools.    . ondansetron (ZOFRAN ODT) 4 MG disintegrating tablet Take 1 tablet (4 mg total) by mouth every 4 (four) hours as needed for nausea or vomiting. (Patient not taking: Reported on 01/17/2015) 20 tablet 0  . oxyCODONE (OXY IR/ROXICODONE) 5 MG immediate release tablet Take 10 mg by mouth every 4 (four) hours as needed for severe pain.     . pantoprazole (PROTONIX) 40 MG tablet Take 40 mg by mouth at bedtime.     . topiramate (TOPAMAX) 50 MG tablet Take 50 mg by mouth 2 (two) times daily.    . vitamin B-12 (CYANOCOBALAMIN)  1000 MCG tablet Take 1,000 mcg by mouth.    . vitamin C (ASCORBIC ACID) 500 MG tablet Take 500 mg by mouth.     No current facility-administered medications for this visit.    REVIEW OF SYSTEMS:    10 Point review of Systems was done is negative except as noted above.  PHYSICAL EXAMINATION: ECOG PERFORMANCE STATUS: 1 - Symptomatic but completely ambulatory  . Filed Vitals:   10/25/15 1140  BP: 117/80  Pulse: 98  Temp: 98.4 F (36.9 C)  Resp: 18   Filed Weights   10/25/15 1140  Weight: 167 lb 8 oz (75.978 kg)   .Body mass index is 27.05 kg/(m^2).  GENERAL:alert, in no acute distress and comfortable SKIN: skin color, texture, turgor are normal, no rashes or significant lesions EYES: normal, conjunctiva are pink and non-injected, sclera clear OROPHARYNX:no exudate, no erythema and lips, buccal mucosa, and tongue normal  NECK: supple, no JVD, thyroid normal size, non-tender, without nodularity LYMPH:  no palpable lymphadenopathy in the cervical, axillary or inguinal LUNGS: clear to auscultation with normal respiratory effort HEART: regular rate & rhythm,  no murmurs and no lower extremity edema ABDOMEN: abdomen soft, non-tender, normoactive bowel sounds , healed surgical scars Musculoskeletal: no cyanosis of digits and no clubbing  PSYCH: alert & oriented x 3 with fluent speech NEURO: no focal motor/sensory deficits  LABORATORY DATA:  I have reviewed the data as listed  . CBC Latest Ref Rng 10/25/2015 06/24/2015 06/04/2015  WBC 3.9 - 10.3 10e3/uL 7.1 6.7 8.9  Hemoglobin 11.6 - 15.9 g/dL 16.112.8 11.3(L) 10.3(L)  Hematocrit 34.8 - 46.6 % 38.5 35.6(L) 32.7(L)  Platelets 145 - 400 10e3/uL 290 265 272   . CBC    Component Value Date/Time   WBC 7.1 10/25/2015 1301   WBC 6.7 06/24/2015 1335   RBC 4.64 10/25/2015 1301   RBC 4.64 06/24/2015 1335   HGB 12.8 10/25/2015 1301   HGB 11.3* 06/24/2015 1335   HCT 38.5 10/25/2015 1301   HCT 35.6* 06/24/2015 1335   PLT 290  10/25/2015 1301   PLT 265 06/24/2015 1335   MCV 83.0 10/25/2015 1301   MCV 76.7* 06/24/2015 1335   MCH 27.6 10/25/2015 1301   MCH 24.4* 06/24/2015 1335   MCHC 33.2 10/25/2015 1301   MCHC 31.7 06/24/2015 1335   RDW 14.2 10/25/2015 1301   RDW 14.3 06/24/2015 1335   LYMPHSABS 2.0 10/25/2015 1301   LYMPHSABS 0.9 06/04/2015 2242   MONOABS 0.5 10/25/2015 1301   MONOABS 0.5 06/04/2015 2242   EOSABS 0.5 10/25/2015 1301   EOSABS 0.1 06/04/2015 2242   BASOSABS 0.1 10/25/2015 1301   BASOSABS 0.0 06/04/2015 2242     .  CMP Latest Ref Rng 10/25/2015 06/24/2015 06/04/2015  Glucose 70 - 140 mg/dl 93 962(X) 528(U)  BUN 7.0 - 26.0 mg/dL 7.8 7 6   Creatinine 0.6 - 1.1 mg/dL 0.8 1.32 4.40  Sodium 102 - 145 mEq/L 142 139 140  Potassium 3.5 - 5.1 mEq/L 3.4(L) 3.8 3.4(L)  Chloride 101 - 111 mmol/L - 109 108  CO2 22 - 29 mEq/L 25 21(L) 22  Calcium 8.4 - 10.4 mg/dL 9.3 9.2 8.9  Total Protein 6.4 - 8.3 g/dL 7.5 7.1 7.2  Total Bilirubin 0.20 - 1.20 mg/dL <7.25 0.2(L) 0.3  Alkaline Phos 40 - 150 U/L 93 72 72  AST 5 - 34 U/L ALT 0 - 55 U/L 17 13(L) 9(L)   . Lab Results  Component Value Date   IRON 75 10/25/2015   TIBC 389 10/25/2015   IRONPCTSAT 19* 10/25/2015   (Iron and TIBC)  Lab Results  Component Value Date   FERRITIN 31 10/25/2015   B12 - 198-->-933  25 OH vit D 10    RADIOGRAPHIC STUDIES: I have personally reviewed the radiological images as listed and agreed with the findings in the report. No results found.  ASSESSMENT & PLAN:    34 yo female with severe ulcerative colitis s/p total colectomy due to intractable disease  1) Iron deficiency with mild anemia due to --blood loss from ulcerative previously + surgeries + poor absorption due to PPI and multiple colon surgeries 2) b12 deficiency due to absorption issues 3) Vit D deficiency due to absorption issues Plan -given the likelihood of significant absorption issues we are faced with uncertain absorption of  nutrient resulting in multiple deficiencies. -we will plan to replace her iron with IV feraheme  IV qweekly x 2 doses. (counselled on detail above risk vs benefits of IV iron and informed consent obtained) -can continue feso4  po daily + vit C for maintenance -B12 qweekly x 4 weeks and then SL daily -will start the patient on B complex due to the likelihood of other co-existing B vit deficiencies -Ergocalciferol 50k units qweekly x 12 doses  RTC with Dr Candise Che in 2 months with rpt cbc, cmp, ferritin, B12 and 25OH vit D  All of the patients questions were answered to her apparent satisfaction. The patient knows to call the clinic with any problems, questions or concerns.  I spent 50 minutes counseling the patient face to face. The total time spent in the appointment was 60 minutes and more than 50% was on counseling and direct patient cares.    Wyvonnia Lora MD MS AAHIVMS [facility] Hematology/Oncology Physician [facility]  (Office):       [phone] (Work cell):  [phone] (Fax):           [phone]

## 2015-11-11 ENCOUNTER — Ambulatory Visit (HOSPITAL_BASED_OUTPATIENT_CLINIC_OR_DEPARTMENT_OTHER): Payer: No Typology Code available for payment source

## 2015-11-11 VITALS — BP 116/66 | HR 82 | Temp 98.3°F | Resp 18

## 2015-11-11 DIAGNOSIS — D509 Iron deficiency anemia, unspecified: Secondary | ICD-10-CM | POA: Diagnosis not present

## 2015-11-11 MED ORDER — SODIUM CHLORIDE 0.9 % IV SOLN
Freq: Once | INTRAVENOUS | Status: AC
  Administered 2015-11-11: 15:00:00 via INTRAVENOUS

## 2015-11-11 MED ORDER — SODIUM CHLORIDE 0.9 % IV SOLN
510.0000 mg | Freq: Once | INTRAVENOUS | Status: AC
  Administered 2015-11-11: 510 mg via INTRAVENOUS
  Filled 2015-11-11: qty 17

## 2015-11-11 MED ORDER — CYANOCOBALAMIN 1000 MCG/ML IJ SOLN
1000.0000 ug | Freq: Once | INTRAMUSCULAR | Status: AC
  Administered 2015-11-11: 1000 ug via SUBCUTANEOUS

## 2015-11-11 MED ORDER — CYANOCOBALAMIN 1000 MCG/ML IJ SOLN
INTRAMUSCULAR | Status: AC
  Filled 2015-11-11: qty 1

## 2015-11-11 NOTE — Patient Instructions (Signed)

## 2015-12-22 ENCOUNTER — Telehealth: Payer: Self-pay | Admitting: Hematology

## 2015-12-22 NOTE — Telephone Encounter (Signed)
cld pt to r/s appt-gave pt r/s time & date-pt agreed

## 2015-12-27 ENCOUNTER — Ambulatory Visit (HOSPITAL_BASED_OUTPATIENT_CLINIC_OR_DEPARTMENT_OTHER): Payer: BLUE CROSS/BLUE SHIELD | Admitting: Hematology

## 2015-12-27 ENCOUNTER — Telehealth: Payer: Self-pay | Admitting: Hematology

## 2015-12-27 ENCOUNTER — Other Ambulatory Visit: Payer: No Typology Code available for payment source

## 2015-12-27 ENCOUNTER — Ambulatory Visit: Payer: No Typology Code available for payment source | Admitting: Hematology

## 2015-12-27 ENCOUNTER — Other Ambulatory Visit (HOSPITAL_BASED_OUTPATIENT_CLINIC_OR_DEPARTMENT_OTHER): Payer: BLUE CROSS/BLUE SHIELD

## 2015-12-27 ENCOUNTER — Encounter: Payer: Self-pay | Admitting: Hematology

## 2015-12-27 VITALS — BP 127/85 | HR 100 | Temp 98.7°F | Resp 18 | Ht 66.0 in | Wt 170.7 lb

## 2015-12-27 DIAGNOSIS — E559 Vitamin D deficiency, unspecified: Secondary | ICD-10-CM | POA: Diagnosis not present

## 2015-12-27 DIAGNOSIS — E538 Deficiency of other specified B group vitamins: Secondary | ICD-10-CM

## 2015-12-27 DIAGNOSIS — D509 Iron deficiency anemia, unspecified: Secondary | ICD-10-CM

## 2015-12-27 LAB — CBC & DIFF AND RETIC
BASO%: 0.3 % (ref 0.0–2.0)
Basophils Absolute: 0 10*3/uL (ref 0.0–0.1)
EOS%: 3 % (ref 0.0–7.0)
Eosinophils Absolute: 0.3 10*3/uL (ref 0.0–0.5)
HCT: 41.3 % (ref 34.8–46.6)
HGB: 14 g/dL (ref 11.6–15.9)
IMMATURE RETIC FRACT: 5.2 % (ref 1.60–10.00)
LYMPH%: 14.1 % (ref 14.0–49.7)
MCH: 29.6 pg (ref 25.1–34.0)
MCHC: 33.9 g/dL (ref 31.5–36.0)
MCV: 87.3 fL (ref 79.5–101.0)
MONO#: 0.3 10*3/uL (ref 0.1–0.9)
MONO%: 3.2 % (ref 0.0–14.0)
NEUT%: 79.4 % — AB (ref 38.4–76.8)
NEUTROS ABS: 6.9 10*3/uL — AB (ref 1.5–6.5)
PLATELETS: 247 10*3/uL (ref 145–400)
RBC: 4.73 10*6/uL (ref 3.70–5.45)
RDW: 13.4 % (ref 11.2–14.5)
Retic %: 2.11 % — ABNORMAL HIGH (ref 0.70–2.10)
Retic Ct Abs: 99.8 10*3/uL — ABNORMAL HIGH (ref 33.70–90.70)
WBC: 8.7 10*3/uL (ref 3.9–10.3)
lymph#: 1.2 10*3/uL (ref 0.9–3.3)

## 2015-12-27 LAB — COMPREHENSIVE METABOLIC PANEL
ALT: 25 U/L (ref 0–55)
ANION GAP: 11 meq/L (ref 3–11)
AST: 19 U/L (ref 5–34)
Albumin: 4.2 g/dL (ref 3.5–5.0)
Alkaline Phosphatase: 84 U/L (ref 40–150)
BILIRUBIN TOTAL: 0.35 mg/dL (ref 0.20–1.20)
BUN: 8.8 mg/dL (ref 7.0–26.0)
CALCIUM: 9.7 mg/dL (ref 8.4–10.4)
CHLORIDE: 110 meq/L — AB (ref 98–109)
CO2: 21 mEq/L — ABNORMAL LOW (ref 22–29)
CREATININE: 0.9 mg/dL (ref 0.6–1.1)
EGFR: 88 mL/min/{1.73_m2} — AB (ref >90)
Glucose: 115 mg/dl (ref 70–140)
Potassium: 3.9 mEq/L (ref 3.5–5.1)
Sodium: 142 mEq/L (ref 136–145)
Total Protein: 7.8 g/dL (ref 6.4–8.3)

## 2015-12-27 LAB — FERRITIN: FERRITIN: 229 ng/mL (ref 9–269)

## 2015-12-27 NOTE — Telephone Encounter (Signed)
Pt confirmed labs/ov per 01/30 POF, gave pt AVS and Calendar... KJ °

## 2015-12-28 LAB — VITAMIN D 25 HYDROXY (VIT D DEFICIENCY, FRACTURES): Vitamin D, 25-Hydroxy: 23.8 ng/mL — ABNORMAL LOW (ref 30.0–100.0)

## 2015-12-28 LAB — VITAMIN B12: Vitamin B12: 1408 pg/mL — ABNORMAL HIGH (ref 211–946)

## 2016-01-03 NOTE — Progress Notes (Signed)
Marland Kitchen    HEMATOLOGY/ONCOLOGY CLINIC NOTE  Date of Service: 01/03/2016  Patient Care Team: Henrine Screws, MD as PCP - General (Family Medicine)  CHIEF COMPLAINTS/PURPOSE OF CONSULTATION:  Evaluation and management of Anemia.  HISTORY OF PRESENTING ILLNESS: Plz see my previous note for details  INTERVAL HISTORY  [name] is here for f/u of iron def anemia. She notes that her energy levels have improved since the IV iron and her anemia has resolved. She notes that she talked to her surgeon and reports that she does not have a normal ileum to absorb B12 and would be high risk for developing B12 deficiency as well. No other acute new symptoms.    MEDICAL HISTORY:  Past Medical History  Diagnosis Date  . Irregular heart beat   . Ulcerative colitis (HCC)   . PCOS (polycystic ovarian syndrome)   . Hypertension   . Iatrogenic Cushing's disease (HCC)   . OSA (obstructive sleep apnea)     using CPAP  . Depression     using celexa  h/o Pancreatitis thought to be related to cholelithiasis.  SURGICAL HISTORY: Past Surgical History  Procedure Laterality Date  . Wisdom tooth extraction    . Colectomy  11/2014    for ulcerative colitis  . Colon repair  04/2015    SOCIAL HISTORY: Social History   Social History  . Marital Status: Single    Spouse Name: N/A  . Number of Children: N/A  . Years of Education: N/A   Occupational History  . Not on file.   Social History Main Topics  . Smoking status: Former Smoker    Quit date: 11/28/2011  . Smokeless tobacco: Never Used  . Alcohol Use: Yes     Comment: occas., once a month or less.  . Drug Use: No  . Sexual Activity: Yes     Comment: boyfriend has had a vasectomy   Other Topics Concern  . Not on file   Social History Narrative    FAMILY HISTORY: Family History  Problem Relation Age of Onset  . Anesthesia problems Neg Hx   . Cervical cancer Maternal Uncle   . Cervical cancer Maternal Grandmother   . Colon cancer  Maternal Grandfather   . Colon cancer Paternal Grandmother   . Colon cancer Paternal Grandfather     ALLERGIES:  is allergic to chocolate; latex; and nickel.  MEDICATIONS:  Current Outpatient Prescriptions  Medication Sig Dispense Refill  . acetaminophen (TYLENOL) 500 MG tablet Take 500 mg by mouth every 6 (six) hours as needed for mild pain.    Marland Kitchen b complex vitamins capsule Take 1 capsule by mouth daily. 60 capsule 3  . buPROPion (WELLBUTRIN XL) 150 MG 24 hr tablet Take 150 mg by mouth.    . cetirizine (ZYRTEC) 10 MG tablet Take 10 mg by mouth daily as needed for allergies.     Marland Kitchen ergocalciferol (VITAMIN D2) 50000 UNITS capsule Take 1 capsule (50,000 Units total) by mouth once a week. 12 capsule 0  . ferrous sulfate 325 (65 FE) MG EC tablet Take 325 mg by mouth.    Marland Kitchen HYDROcodone-acetaminophen (NORCO/VICODIN) 5-325 MG per tablet Take 1-2 tablets by mouth every 6 (six) hours as needed for moderate pain or severe pain. 11 tablet 0  . loperamide (IMODIUM) 2 MG capsule Take 2 mg by mouth daily as needed for diarrhea or loose stools.    . ondansetron (ZOFRAN ODT) 4 MG disintegrating tablet Take 1 tablet (4 mg total) by mouth  every 4 (four) hours as needed for nausea or vomiting. 20 tablet 0  . oxyCODONE (OXY IR/ROXICODONE) 5 MG immediate release tablet Take 10 mg by mouth every 4 (four) hours as needed for severe pain.     . pantoprazole (PROTONIX) 40 MG tablet Take 40 mg by mouth at bedtime.     . topiramate (TOPAMAX) 50 MG tablet Take 50 mg by mouth 2 (two) times daily.    . vitamin B-12 (CYANOCOBALAMIN) 1000 MCG tablet Take 500 mcg by mouth.      No current facility-administered medications for this visit.    REVIEW OF SYSTEMS:    10 Point review of Systems was done is negative except as noted above.  PHYSICAL EXAMINATION: ECOG PERFORMANCE STATUS: 1 - Symptomatic but completely ambulatory  . Filed Vitals:   12/27/15 0937  BP: 127/85  Pulse: 100  Temp: 98.7 F (37.1 C)  Resp: 18     Filed Weights   12/27/15 0937  Weight: 170 lb 11.2 oz (77.429 kg)   .Body mass index is 27.56 kg/(m^2).  GENERAL:alert, in no acute distress and comfortable SKIN: skin color, texture, turgor are normal, no rashes or significant lesions EYES: normal, conjunctiva are pink and non-injected, sclera clear OROPHARYNX:no exudate, no erythema and lips, buccal mucosa, and tongue normal  NECK: supple, no JVD, thyroid normal size, non-tender, without nodularity LYMPH:  no palpable lymphadenopathy in the cervical, axillary or inguinal LUNGS: clear to auscultation with normal respiratory effort HEART: regular rate & rhythm,  no murmurs and no lower extremity edema ABDOMEN: abdomen soft, non-tender, normoactive bowel sounds , healed surgical scars Musculoskeletal: no cyanosis of digits and no clubbing  PSYCH: alert & oriented x 3 with fluent speech NEURO: no focal motor/sensory deficits  LABORATORY DATA:  I have reviewed the data as listed  . CBC Latest Ref Rng 12/27/2015 10/25/2015 06/24/2015  WBC 3.9 - 10.3 10e3/uL 8.7 7.1 6.7  Hemoglobin 11.6 - 15.9 g/dL 40.9 81.1 11.3(L)  Hematocrit 34.8 - 46.6 % 41.3 38.5 35.6(L)  Platelets 145 - 400 10e3/uL 247 290 265   . CBC    Component Value Date/Time   WBC 8.7 12/27/2015 0908   WBC 6.7 06/24/2015 1335   RBC 4.73 12/27/2015 0908   RBC 4.64 06/24/2015 1335   HGB 14.0 12/27/2015 0908   HGB 11.3* 06/24/2015 1335   HCT 41.3 12/27/2015 0908   HCT 35.6* 06/24/2015 1335   PLT 247 12/27/2015 0908   PLT 265 06/24/2015 1335   MCV 87.3 12/27/2015 0908   MCV 76.7* 06/24/2015 1335   MCH 29.6 12/27/2015 0908   MCH 24.4* 06/24/2015 1335   MCHC 33.9 12/27/2015 0908   MCHC 31.7 06/24/2015 1335   RDW 13.4 12/27/2015 0908   RDW 14.3 06/24/2015 1335   LYMPHSABS 1.2 12/27/2015 0908   LYMPHSABS 0.9 06/04/2015 2242   MONOABS 0.3 12/27/2015 0908   MONOABS 0.5 06/04/2015 2242   EOSABS 0.3 12/27/2015 0908   EOSABS 0.1 06/04/2015 2242   BASOSABS 0.0  12/27/2015 0908   BASOSABS 0.0 06/04/2015 2242     . CMP Latest Ref Rng 12/27/2015 10/25/2015 06/24/2015  Glucose 70 - 140 mg/dl 914 93 782(N)  BUN 7.0 - 26.0 mg/dL 8.8 7.8 7  Creatinine 0.6 - 1.1 mg/dL 0.9 0.8 5.62  Sodium 130 - 145 mEq/L 142 142 139  Potassium 3.5 - 5.1 mEq/L 3.9 3.4(L) 3.8  Chloride 101 - 111 mmol/L - - 109  CO2 22 - 29 mEq/L 21(L) 25 21(L)  Calcium  8.4 - 10.4 mg/dL 9.7 9.3 9.2  Total Protein 6.4 - 8.3 g/dL 7.8 7.5 7.1  Total Bilirubin 0.20 - 1.20 mg/dL 0.98 <1.19 0.2(L)  Alkaline Phos 40 - 150 U/L 84 93 72  AST 5 - 34 U/L 19 18 29   ALT 0 - 55 U/L 25 17 13(L)   . Lab Results  Component Value Date   IRON 75 10/25/2015   TIBC 389 10/25/2015   IRONPCTSAT 19* 10/25/2015   (Iron and TIBC)  Lab Results  Component Value Date   FERRITIN 229 12/27/2015   B12 - 198-->-933---> 1408  25 OH vit D 10-->23.8    RADIOGRAPHIC STUDIES: I have personally reviewed the radiological images as listed and agreed with the findings in the report. No results found.  ASSESSMENT & PLAN:    35 yo female with severe ulcerative colitis s/p total colectomy due to intractable disease  1) Iron deficiency with mild anemia due to --blood loss from ulcerative colitis + surgeries + poor absorption due to PPI and multiple colon surgeries. Iron deficiency and anemia resolved with IV iron. (Ferritin 229) 2) b12 deficiency due to absorption issues. Resolved with [place] B12 replacement.  3) Vit D deficiency due to absorption issues improving with high dose Vit D replacement.  Plan -no indication for additional IV Feraheme at this time.  -can continue feso4 325mg  po daily + vit C for maintenance -s/p B12 qweekly x 4 weeks now continue SL daily -continue on B complex due to the likelihood of other co-existing B vit deficiencies -Ergocalciferol 50k units qweekly x 12 doses - improving - continue replacement.  RTC with Dr Candise Che in 3 months with rpt cbc, cmp, ferritin, B12 and 25OH vit  D  All of the patients questions were answered to her apparent satisfaction. The patient knows to call the clinic with any problems, questions or concerns.  Wyvonnia Lora MD MS AAHIVMS [facility] Hematology/Oncology Physician [facility]  (Office):       [phone] (Work cell):  [phone] (Fax):           [phone]

## 2016-02-04 IMAGING — CT CT ABD-PELV W/ CM
2 of 4 series · 16 of 46 positions shown, 18 images · IV contrast (Omni 300)
Comparison: June 04, 2015

CLINICAL DATA: Right lower quadrant pain after colon resection
several weeks ago. Patient states she had a complete colectomy.

EXAM:
CT ABDOMEN AND PELVIS WITH CONTRAST
TECHNIQUE: Multidetector CT imaging of the abdomen and pelvis was performed
using the standard protocol following bolus administration of
intravenous contrast.
CONTRAST:  100mL OMNIPAQUE IOHEXOL 300 MG/ML  SOLN

[Series 2: abd/ pelvis 5.0 i30f 1 · axial · 0.68mm/px · z∈[+778,+1198]mm · 13 of 92 slices shown, 15 images]
[im 4/92  soft-tissue]
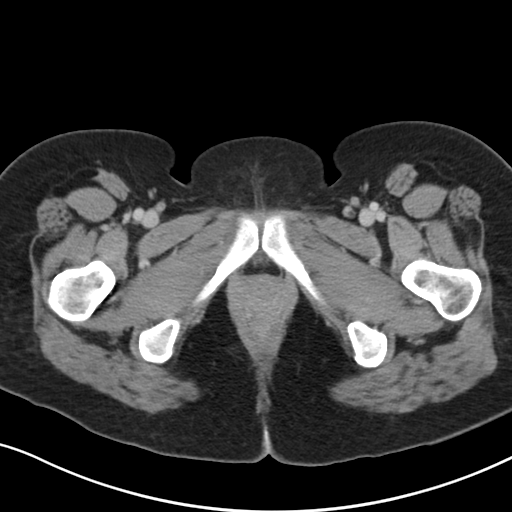
[im 4/92  bone]
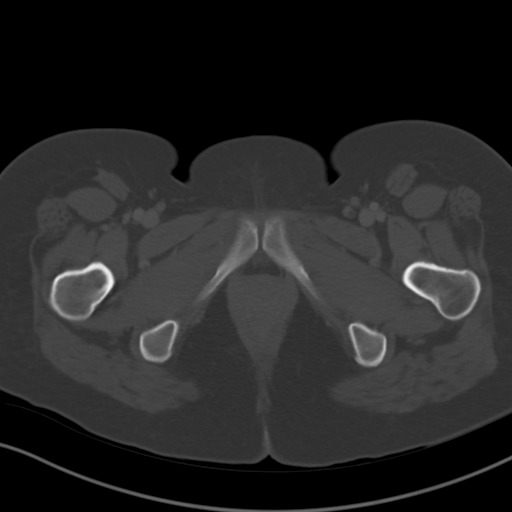
[im 12/92  soft-tissue]
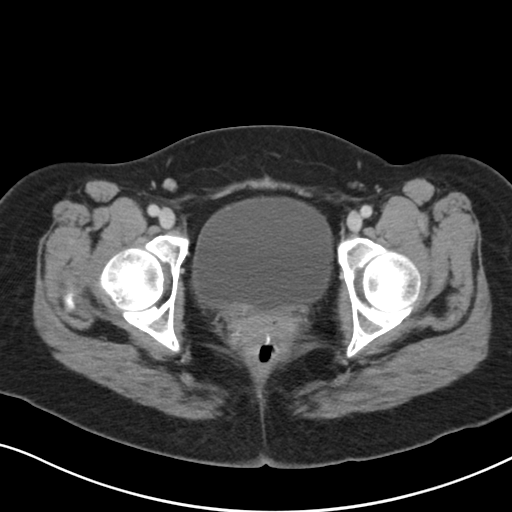
[im 19/92  soft-tissue]
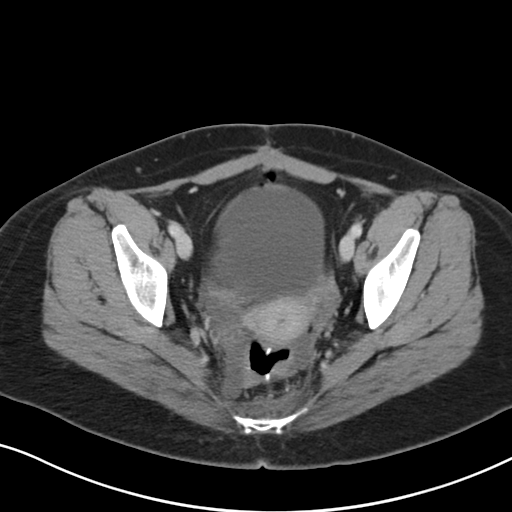
[im 27/92  soft-tissue]
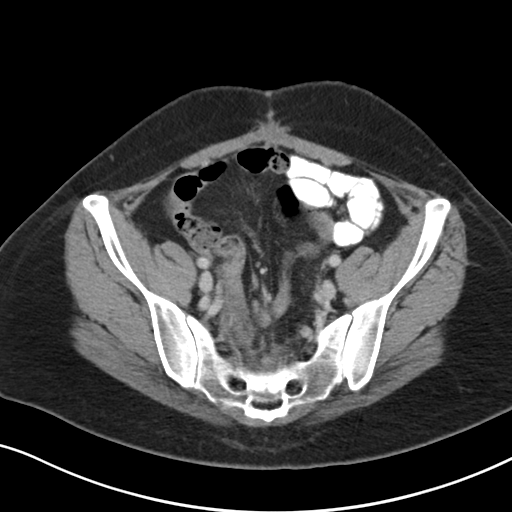
[im 31/92  soft-tissue]
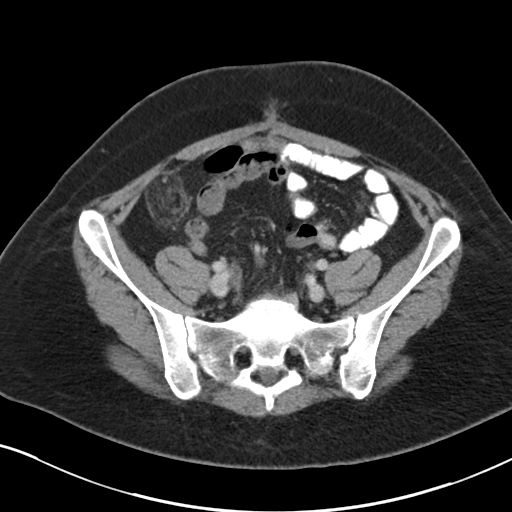
[im 38/92  soft-tissue]
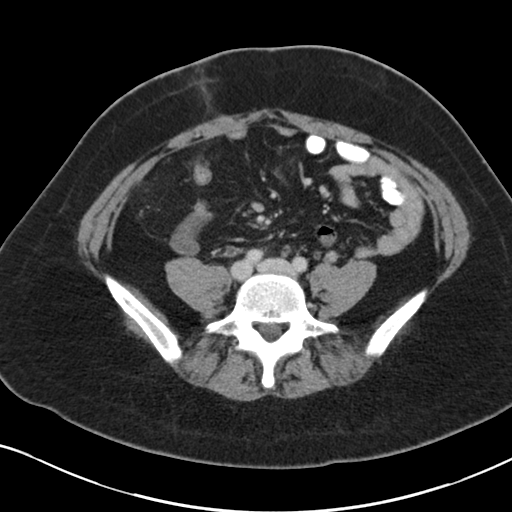
[im 46/92  soft-tissue]
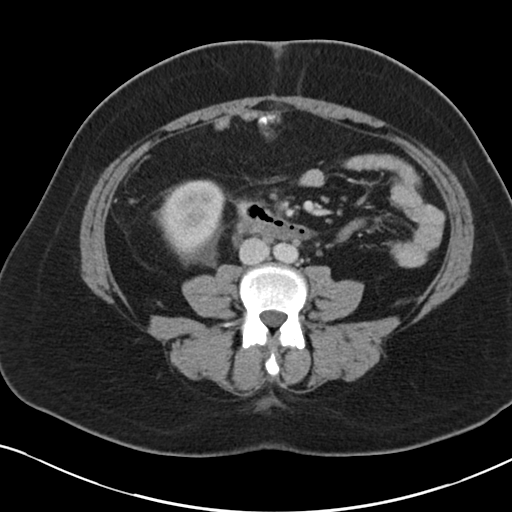
[im 54/92  soft-tissue]
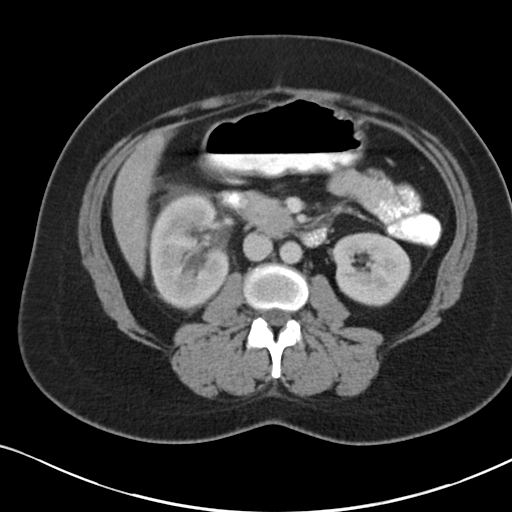
[im 61/92  soft-tissue]
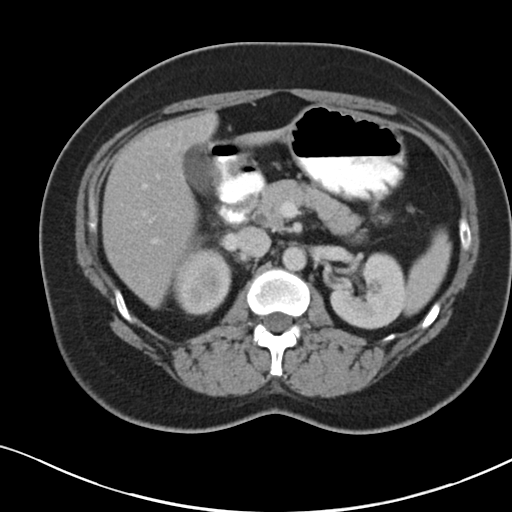
[im 61/92  bone]
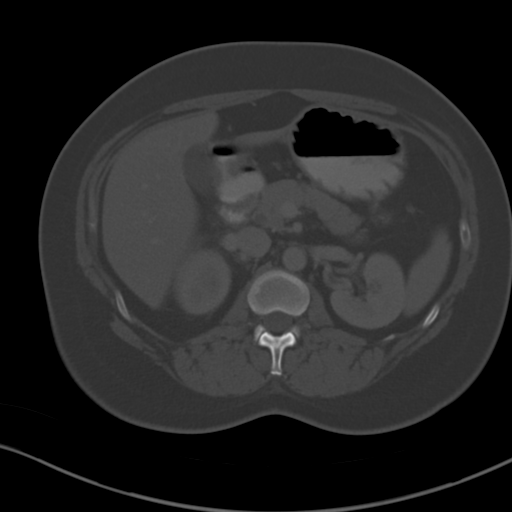
[im 65/92  soft-tissue]
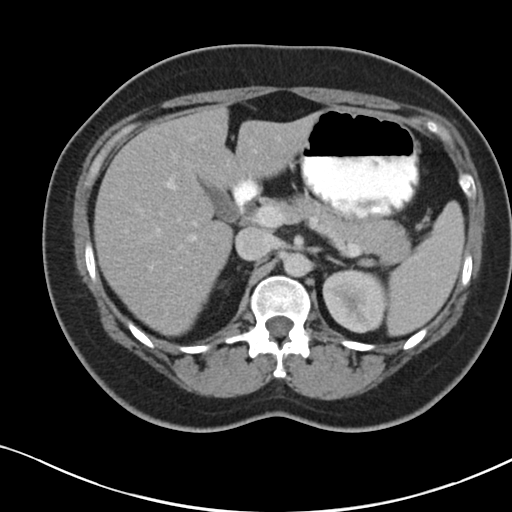
[im 73/92  soft-tissue]
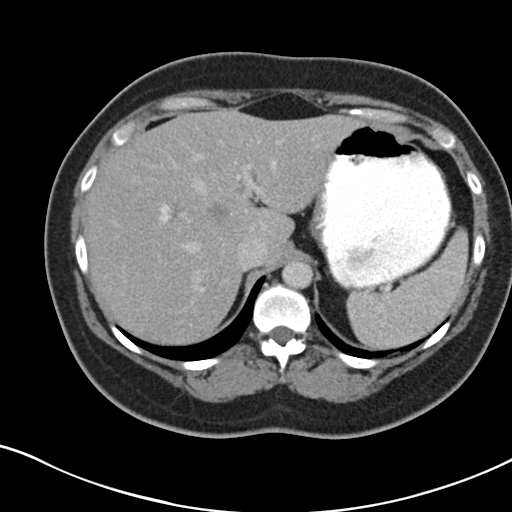
[im 80/92  soft-tissue]
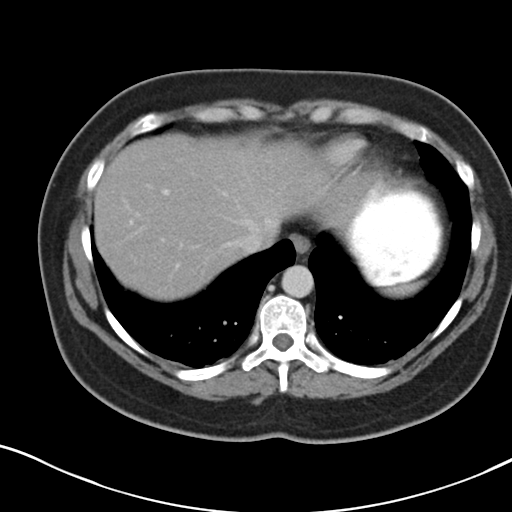
[im 88/92  soft-tissue]
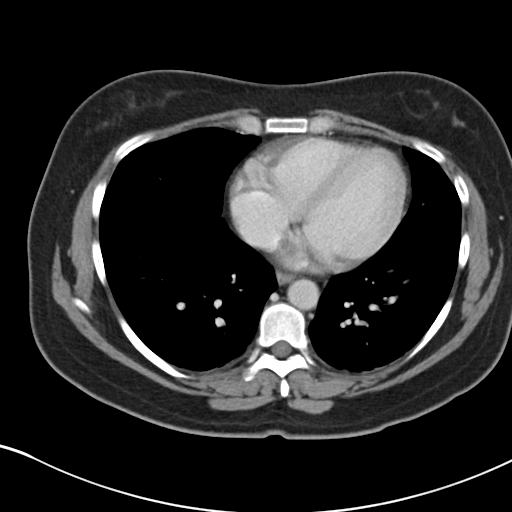

[Series 5: coronals · coronal · 0.66mm/px · 3 of 137 slices shown]
[im 46/137  soft-tissue]
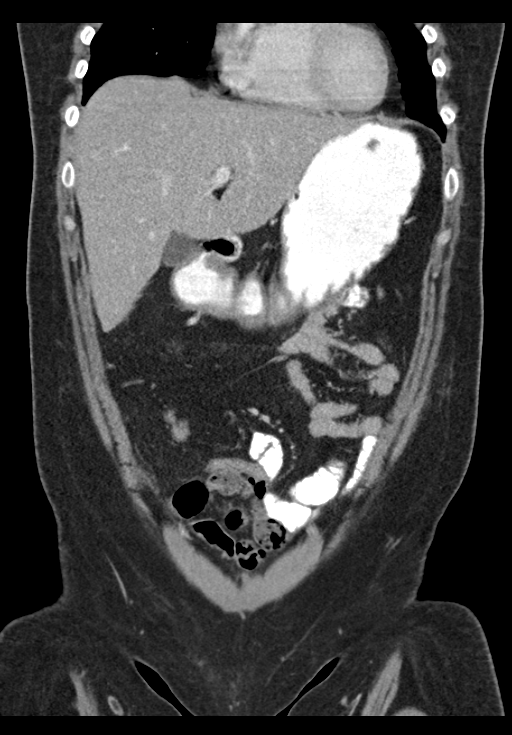
[im 61/137  soft-tissue]
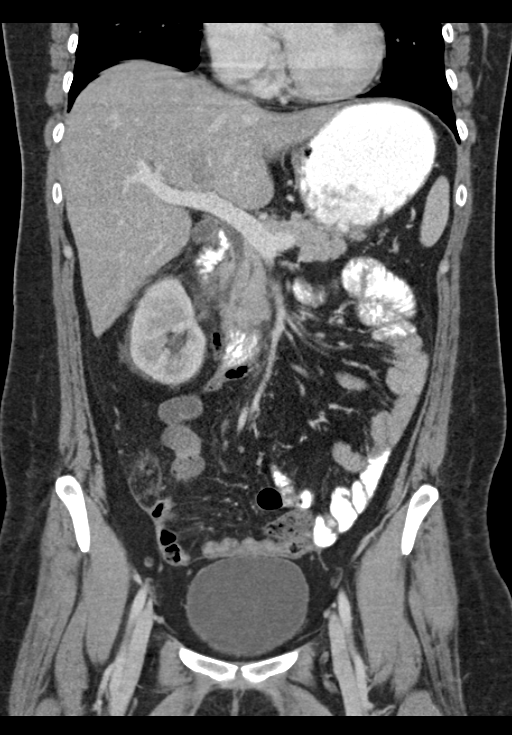
[im 76/137  soft-tissue]
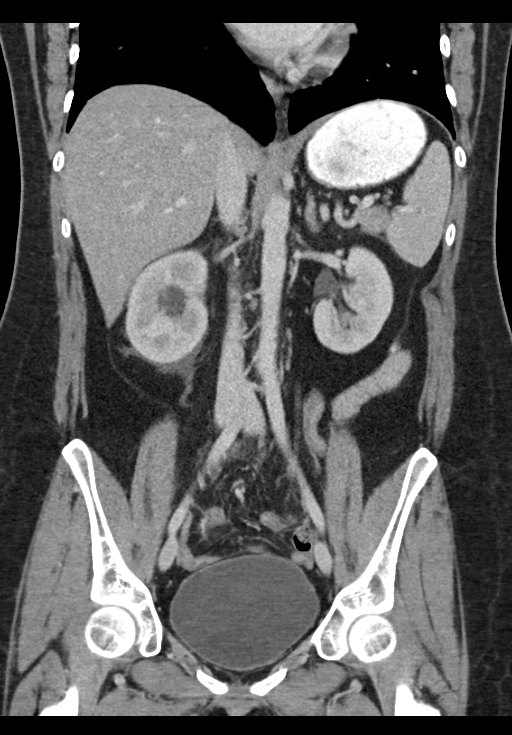

[16 of 46 positions shown; findings below may reference images not displayed]

FINDINGS: There is right hydronephrosis and right perinephric stranding. There
is a focus air in the mid right ureter lumen.

There is focal fatty infiltration of liver near the falciform
ligament. The liver is otherwise normal. The spleen, pancreas,
gallbladder, adrenal glands are normal. The aorta is normal. There
is no abdominal lymphadenopathy.

Patient status post prior colectomy. There is no small bowel
obstruction. There is mesenteric stranding persistent in the right
lower quadrant unchanged since prior CT. There is free fluid in the
pelvis not significantly changed.

Fluid-filled bladder with focus air in the bladder lumen is
identified. The uterus is normal. It free fluid is identified in the
pelvis. There is mild atelectasis of the posterior lung bases. The
visualized bones are normal.
IMPRESSION: Right hydronephrosis and right perinephric stranding with focus air
in the mid right ureter lumen.

The findings are worsened compared to prior exam. Findings are
consistent with urinary tract infection.

The previously noted mesenteric stranding in the right lower
quadrant is unchanged.

Free fluid is identified the pelvis unchanged.

## 2016-03-24 ENCOUNTER — Telehealth: Payer: Self-pay | Admitting: Hematology

## 2016-03-24 ENCOUNTER — Ambulatory Visit (HOSPITAL_BASED_OUTPATIENT_CLINIC_OR_DEPARTMENT_OTHER): Payer: Commercial Managed Care - HMO | Admitting: Hematology

## 2016-03-24 ENCOUNTER — Encounter: Payer: Self-pay | Admitting: Hematology

## 2016-03-24 ENCOUNTER — Other Ambulatory Visit (HOSPITAL_BASED_OUTPATIENT_CLINIC_OR_DEPARTMENT_OTHER): Payer: Commercial Managed Care - HMO

## 2016-03-24 VITALS — BP 126/74 | HR 85 | Temp 98.2°F | Resp 18 | Ht 66.0 in | Wt 171.8 lb

## 2016-03-24 DIAGNOSIS — D509 Iron deficiency anemia, unspecified: Secondary | ICD-10-CM

## 2016-03-24 DIAGNOSIS — E559 Vitamin D deficiency, unspecified: Secondary | ICD-10-CM

## 2016-03-24 DIAGNOSIS — E538 Deficiency of other specified B group vitamins: Secondary | ICD-10-CM

## 2016-03-24 LAB — COMPREHENSIVE METABOLIC PANEL
ALBUMIN: 4.2 g/dL (ref 3.5–5.0)
ALK PHOS: 89 U/L (ref 40–150)
ALT: 28 U/L (ref 0–55)
ANION GAP: 10 meq/L (ref 3–11)
AST: 21 U/L (ref 5–34)
BILIRUBIN TOTAL: 0.41 mg/dL (ref 0.20–1.20)
BUN: 9.3 mg/dL (ref 7.0–26.0)
CO2: 25 meq/L (ref 22–29)
CREATININE: 0.8 mg/dL (ref 0.6–1.1)
Calcium: 9.5 mg/dL (ref 8.4–10.4)
Chloride: 108 mEq/L (ref 98–109)
GLUCOSE: 96 mg/dL (ref 70–140)
Potassium: 3.9 mEq/L (ref 3.5–5.1)
SODIUM: 142 meq/L (ref 136–145)
Total Protein: 7.5 g/dL (ref 6.4–8.3)

## 2016-03-24 LAB — CBC & DIFF AND RETIC
BASO%: 0.4 % (ref 0.0–2.0)
Basophils Absolute: 0 10*3/uL (ref 0.0–0.1)
EOS%: 4.1 % (ref 0.0–7.0)
Eosinophils Absolute: 0.3 10*3/uL (ref 0.0–0.5)
HCT: 41.2 % (ref 34.8–46.6)
HGB: 14.2 g/dL (ref 11.6–15.9)
Immature Retic Fract: 6.3 % (ref 1.60–10.00)
LYMPH%: 19.9 % (ref 14.0–49.7)
MCH: 30.9 pg (ref 25.1–34.0)
MCHC: 34.5 g/dL (ref 31.5–36.0)
MCV: 89.8 fL (ref 79.5–101.0)
MONO#: 0.4 10*3/uL (ref 0.1–0.9)
MONO%: 5.2 % (ref 0.0–14.0)
NEUT%: 70.4 % (ref 38.4–76.8)
NEUTROS ABS: 5.9 10*3/uL (ref 1.5–6.5)
Platelets: 265 10*3/uL (ref 145–400)
RBC: 4.59 10*6/uL (ref 3.70–5.45)
RDW: 12.1 % (ref 11.2–14.5)
Retic %: 1.94 % (ref 0.70–2.10)
Retic Ct Abs: 89.05 10*3/uL (ref 33.70–90.70)
WBC: 8.3 10*3/uL (ref 3.9–10.3)
lymph#: 1.7 10*3/uL (ref 0.9–3.3)

## 2016-03-24 LAB — IRON AND TIBC
%SAT: 30 % (ref 21–57)
IRON: 105 ug/dL (ref 41–142)
TIBC: 354 ug/dL (ref 236–444)
UIBC: 249 ug/dL (ref 120–384)

## 2016-03-24 LAB — FERRITIN: Ferritin: 160 ng/ml (ref 9–269)

## 2016-03-24 NOTE — Telephone Encounter (Signed)
per pof to sch pt appt-gave pt copy of avs °

## 2016-03-25 LAB — VITAMIN B12: Vitamin B12: 832 pg/mL (ref 211–946)

## 2016-03-25 LAB — VITAMIN D 25 HYDROXY (VIT D DEFICIENCY, FRACTURES): Vitamin D, 25-Hydroxy: 16.4 ng/mL — ABNORMAL LOW (ref 30.0–100.0)

## 2016-03-28 MED ORDER — ERGOCALCIFEROL 1.25 MG (50000 UT) PO CAPS: 50000.0000 [IU] | ORAL_CAPSULE | ORAL | Status: DC

## 2016-03-28 MED ORDER — FERROUS SULFATE 325 (65 FE) MG PO TBEC: 325.0000 mg | DELAYED_RELEASE_TABLET | Freq: Every day | ORAL | Status: AC

## 2016-03-28 NOTE — Progress Notes (Signed)
Kathleen Kitchen    HEMATOLOGY/ONCOLOGY CLINIC NOTE  Date of Service: 03/28/2016  Patient Care Team: Henrine Screws, MD as PCP - General (Family Medicine)  CHIEF COMPLAINTS/PURPOSE OF CONSULTATION:  Evaluation and management of Anemia.  HISTORY OF PRESENTING ILLNESS: Plz see my previous note for details  INTERVAL HISTORY  Kathleen Mcdonald is here for f/u of iron def anemia. She notes that her energy levels have been good. She overall feels quite well. No other acute new concerns. Energy level still been good. Hemoglobin levels remain normal with ferritin levels of more than 100. No evidence of GI bleeding.  MEDICAL HISTORY:  Past Medical History  Diagnosis Date  . Irregular heart beat   . Ulcerative colitis (HCC)   . PCOS (polycystic ovarian syndrome)   . Hypertension   . Iatrogenic Cushing's disease (HCC)   . OSA (obstructive sleep apnea)     using CPAP  . Depression     using celexa  h/o Pancreatitis thought to be related to cholelithiasis.  SURGICAL HISTORY: Past Surgical History  Procedure Laterality Date  . Wisdom tooth extraction    . Colectomy  11/2014    for ulcerative colitis  . Colon repair  04/2015    SOCIAL HISTORY: Social History   Social History  . Marital Status: Single    Spouse Name: N/A  . Number of Children: N/A  . Years of Education: N/A   Occupational History  . Not on file.   Social History Main Topics  . Smoking status: Former Smoker    Quit date: 11/28/2011  . Smokeless tobacco: Never Used  . Alcohol Use: Yes     Comment: occas., once a month or less.  . Drug Use: No  . Sexual Activity: Yes     Comment: boyfriend has had a vasectomy   Other Topics Concern  . Not on file   Social History Narrative    FAMILY HISTORY: Family History  Problem Relation Age of Onset  . Anesthesia problems Neg Hx   . Cervical cancer Maternal Uncle   . Cervical cancer Maternal Grandmother   . Colon cancer Maternal Grandfather   . Colon cancer Paternal Grandmother     . Colon cancer Paternal Grandfather     ALLERGIES:  is allergic to chocolate; latex; and nickel.  MEDICATIONS:  Current Outpatient Prescriptions  Medication Sig Dispense Refill  . FOLIC ACID PO Take by mouth.    Kathleen Kitchen b complex vitamins capsule Take 1 capsule by mouth daily. 60 capsule 3  . buPROPion (WELLBUTRIN XL) 150 MG 24 hr tablet Take 150 mg by mouth.    . cetirizine (ZYRTEC) 10 MG tablet Take 10 mg by mouth daily as needed for allergies.     . clindamycin (CLINDAGEL) 1 % gel APP TO ACNE  QHS  2  . loperamide (IMODIUM) 2 MG capsule Take 2 mg by mouth daily as needed for diarrhea or loose stools.    Kathleen Kitchen METRONIDAZOLE, TOPICAL, 0.75 % LOTN APP ONTO THE SKIN ONCE D UTD  2  . ondansetron (ZOFRAN ODT) 4 MG disintegrating tablet Take 1 tablet (4 mg total) by mouth every 4 (four) hours as needed for nausea or vomiting. 20 tablet 0  . pantoprazole (PROTONIX) 40 MG tablet Take 40 mg by mouth at bedtime.     . topiramate (TOPAMAX) 50 MG tablet Take 50 mg by mouth 2 (two) times daily.    . vitamin B-12 (CYANOCOBALAMIN) 1000 MCG tablet Take 500 mcg by mouth.  No current facility-administered medications for this visit.    REVIEW OF SYSTEMS:    10 Point review of Systems was done is negative except as noted above.  PHYSICAL EXAMINATION: ECOG PERFORMANCE STATUS: 1 - Symptomatic but completely ambulatory  . Filed Vitals:   03/24/16 0932  BP: 126/74  Pulse: 85  Temp: 98.2 F (36.8 C)  Resp: 18   Filed Weights   03/24/16 0932  Weight: 171 lb 12.8 oz (77.928 kg)   .Body mass index is 27.74 kg/(m^2).  GENERAL:alert, in no acute distress and comfortable SKIN: skin color, texture, turgor are normal, no rashes or significant lesions EYES: normal, conjunctiva are pink and non-injected, sclera clear OROPHARYNX:no exudate, no erythema and lips, buccal mucosa, and tongue normal  NECK: supple, no JVD, thyroid normal size, non-tender, without nodularity LYMPH:  no palpable lymphadenopathy  in the cervical, axillary or inguinal LUNGS: clear to auscultation with normal respiratory effort HEART: regular rate & rhythm,  no murmurs and no lower extremity edema ABDOMEN: abdomen soft, non-tender, normoactive bowel sounds , healed surgical scars Musculoskeletal: no cyanosis of digits and no clubbing  PSYCH: alert & oriented x 3 with fluent speech NEURO: no focal motor/sensory deficits  LABORATORY DATA:  I have reviewed the data as listed  . CBC Latest Ref Rng 03/24/2016 12/27/2015 10/25/2015  WBC 3.9 - 10.3 10e3/uL 8.3 8.7 7.1  Hemoglobin 11.6 - 15.9 g/dL 16.114.2 09.614.0 04.512.8  Hematocrit 34.8 - 46.6 % 41.2 41.3 38.5  Platelets 145 - 400 10e3/uL 265 247 290   . CMP Latest Ref Rng 03/24/2016 12/27/2015 10/25/2015  Glucose 70 - 140 mg/dl 96 409115 93  BUN 7.0 - 81.126.0 mg/dL 9.3 8.8 7.8  Creatinine 0.6 - 1.1 mg/dL 0.8 0.9 0.8  Sodium 914136 - 145 mEq/L 142 142 142  Potassium 3.5 - 5.1 mEq/L 3.9 3.9 3.4(L)  Chloride 101 - 111 mmol/L - - -  CO2 22 - 29 mEq/L 25 21(L) 25  Calcium 8.4 - 10.4 mg/dL 9.5 9.7 9.3  Total Protein 6.4 - 8.3 g/dL 7.5 7.8 7.5  Total Bilirubin 0.20 - 1.20 mg/dL 7.820.41 9.560.35 <2.13<0.30  Alkaline Phos 40 - 150 U/L 89 84 93  AST 5 - 34 U/L 21 19 18   ALT 0 - 55 U/L 28 25 17    . Lab Results  Component Value Date   IRON 105 03/24/2016   TIBC 354 03/24/2016   IRONPCTSAT 30 03/24/2016   (Iron and TIBC)  Lab Results  Component Value Date   FERRITIN 160 03/24/2016   B12 - 198-->-933---> 1408-->832  25 OH vit D 10-->23.8-->16.4    RADIOGRAPHIC STUDIES: I have personally reviewed the radiological images as listed and agreed with the findings in the report. No results found.  ASSESSMENT & PLAN:    35 yo female with severe ulcerative colitis s/p total colectomy due to intractable disease  1) Iron deficiency with mild anemia due to --blood loss from ulcerative colitis + surgeries + poor absorption due to PPI and multiple colon surgeries. Iron deficiency and anemia resolved  with IV iron. (Ferritin 229) 2) b12 deficiency due to absorption issues. Resolved with [place] B12 replacement.  3) Vit D deficiency due to absorption issues - still low Plan -no indication for additional IV Feraheme at this time.  -continue feso4 325mg  po daily + vit C for maintenance -continue 500mcg SL daily +  B complex due to the likelihood of other co-existing B vit deficiencies -Ergocalciferol  Increase to 50k units twice qweekly x  24 doses  RTC with Dr Candise Che in 4 months with rpt cbc, cmp, ferritin, B12 and 25OH vit D  All of the patients questions were answered to her apparent satisfaction. The patient knows to call the clinic with any problems, questions or concerns.  Wyvonnia Lora MD Kathleen AAHIVMS [facility] Hematology/Oncology Physician [facility]  (Office):       [phone] (Work cell):  [phone] (Fax):           [phone]

## 2016-03-29 ENCOUNTER — Encounter: Payer: Self-pay | Admitting: *Deleted

## 2016-03-29 NOTE — Progress Notes (Signed)
Called patient to inform her that her Vitamin D levels were low and [facility] to take Vitamin D tablets twice weekly. Patient informed and verbalized understanding.

## 2016-07-06 ENCOUNTER — Other Ambulatory Visit: Payer: Self-pay | Admitting: Hematology

## 2016-07-06 DIAGNOSIS — E559 Vitamin D deficiency, unspecified: Secondary | ICD-10-CM

## 2016-07-24 ENCOUNTER — Telehealth: Payer: Self-pay | Admitting: Hematology

## 2016-07-24 ENCOUNTER — Encounter: Payer: Self-pay | Admitting: Hematology

## 2016-07-24 ENCOUNTER — Ambulatory Visit (HOSPITAL_BASED_OUTPATIENT_CLINIC_OR_DEPARTMENT_OTHER): Payer: Commercial Managed Care - HMO | Admitting: Hematology

## 2016-07-24 ENCOUNTER — Other Ambulatory Visit (HOSPITAL_BASED_OUTPATIENT_CLINIC_OR_DEPARTMENT_OTHER): Payer: Commercial Managed Care - HMO

## 2016-07-24 VITALS — BP 121/80 | HR 81 | Temp 98.7°F | Resp 18 | Wt 178.6 lb

## 2016-07-24 DIAGNOSIS — D538 Other specified nutritional anemias: Secondary | ICD-10-CM | POA: Diagnosis not present

## 2016-07-24 DIAGNOSIS — D500 Iron deficiency anemia secondary to blood loss (chronic): Secondary | ICD-10-CM

## 2016-07-24 DIAGNOSIS — E538 Deficiency of other specified B group vitamins: Secondary | ICD-10-CM

## 2016-07-24 DIAGNOSIS — K5190 Ulcerative colitis, unspecified, without complications: Secondary | ICD-10-CM

## 2016-07-24 DIAGNOSIS — D509 Iron deficiency anemia, unspecified: Secondary | ICD-10-CM

## 2016-07-24 DIAGNOSIS — E559 Vitamin D deficiency, unspecified: Secondary | ICD-10-CM | POA: Diagnosis not present

## 2016-07-24 LAB — CBC & DIFF AND RETIC
BASO%: 0.5 % (ref 0.0–2.0)
Basophils Absolute: 0 10*3/uL (ref 0.0–0.1)
EOS%: 7.2 % — AB (ref 0.0–7.0)
Eosinophils Absolute: 0.6 10*3/uL — ABNORMAL HIGH (ref 0.0–0.5)
HCT: 41.4 % (ref 34.8–46.6)
HGB: 14.2 g/dL (ref 11.6–15.9)
Immature Retic Fract: 9.3 % (ref 1.60–10.00)
LYMPH%: 21.1 % (ref 14.0–49.7)
MCH: 30.7 pg (ref 25.1–34.0)
MCHC: 34.3 g/dL (ref 31.5–36.0)
MCV: 89.4 fL (ref 79.5–101.0)
MONO#: 0.6 10*3/uL (ref 0.1–0.9)
MONO%: 7 % (ref 0.0–14.0)
NEUT%: 64.2 % (ref 38.4–76.8)
NEUTROS ABS: 5.3 10*3/uL (ref 1.5–6.5)
PLATELETS: 256 10*3/uL (ref 145–400)
RBC: 4.63 10*6/uL (ref 3.70–5.45)
RDW: 12.2 % (ref 11.2–14.5)
Retic %: 1.79 % (ref 0.70–2.10)
Retic Ct Abs: 82.88 10*3/uL (ref 33.70–90.70)
WBC: 8.3 10*3/uL (ref 3.9–10.3)
lymph#: 1.8 10*3/uL (ref 0.9–3.3)

## 2016-07-24 LAB — COMPREHENSIVE METABOLIC PANEL
ALT: 16 U/L (ref 0–55)
ANION GAP: 11 meq/L (ref 3–11)
AST: 14 U/L (ref 5–34)
Albumin: 3.9 g/dL (ref 3.5–5.0)
Alkaline Phosphatase: 76 U/L (ref 40–150)
BUN: 9.1 mg/dL (ref 7.0–26.0)
CHLORIDE: 107 meq/L (ref 98–109)
CO2: 24 meq/L (ref 22–29)
CREATININE: 0.8 mg/dL (ref 0.6–1.1)
Calcium: 9.8 mg/dL (ref 8.4–10.4)
Glucose: 99 mg/dl (ref 70–140)
POTASSIUM: 4.3 meq/L (ref 3.5–5.1)
SODIUM: 142 meq/L (ref 136–145)
Total Bilirubin: 0.43 mg/dL (ref 0.20–1.20)
Total Protein: 7.5 g/dL (ref 6.4–8.3)

## 2016-07-24 LAB — FERRITIN: FERRITIN: 121 ng/mL (ref 9–269)

## 2016-07-24 NOTE — Patient Instructions (Signed)
-  Would recommend taking iron polysaccharide 150 mg by mouth daily with orange juice for maintenance treatment for iron deficiency.

## 2016-07-24 NOTE — Telephone Encounter (Signed)
Left message for patient re February 2018 appointments. Schedule mailed. °

## 2016-07-24 NOTE — Progress Notes (Signed)
Kathleen Kitchen    HEMATOLOGY/ONCOLOGY CLINIC NOTE  Date of Service: 07/24/2016  Patient Care Team: Henrine Screws, MD as PCP - General (Family Medicine)  CHIEF COMPLAINTS/PURPOSE OF CONSULTATION:  Evaluation and management of Anemia.  HISTORY OF PRESENTING ILLNESS: Plz see my previous note for details  INTERVAL HISTORY  [name] is here for f/u of iron def anemia. She notes no evidence of overt GI bleeding and notes that her energy levels have been good. She [place] has been more physically active and has been trying to lose weight. No other acute symptoms at this time.  MEDICAL HISTORY:  Past Medical History:  Diagnosis Date  . Depression    using celexa  . Hypertension   . Iatrogenic Cushing's disease (HCC)   . Irregular heart beat   . OSA (obstructive sleep apnea)    using CPAP  . PCOS (polycystic ovarian syndrome)   . Ulcerative colitis (HCC)   h/o Pancreatitis thought to be related to cholelithiasis.  SURGICAL HISTORY: Past Surgical History:  Procedure Laterality Date  . COLECTOMY  11/2014   for ulcerative colitis  . colon repair  04/2015  . WISDOM TOOTH EXTRACTION      SOCIAL HISTORY: Social History   Social History  . Marital status: Single    Spouse name: N/A  . Number of children: N/A  . Years of education: N/A   Occupational History  . Not on file.   Social History Main Topics  . Smoking status: Former Smoker    Quit date: 11/28/2011  . Smokeless tobacco: Never Used  . Alcohol use Yes     Comment: occas., once a month or less.  . Drug use: No  . Sexual activity: Yes     Comment: boyfriend has had a vasectomy   Other Topics Concern  . Not on file   Social History Narrative  . No narrative on file    FAMILY HISTORY: Family History  Problem Relation Age of Onset  . Cervical cancer Maternal Uncle   . Cervical cancer Maternal Grandmother   . Colon cancer Maternal Grandfather   . Colon cancer Paternal  Grandmother   . Colon cancer Paternal Grandfather   . Anesthesia problems Neg Hx     ALLERGIES:  is allergic to chocolate; latex; and nickel.  MEDICATIONS:  Current Outpatient Prescriptions  Medication Sig Dispense Refill  . b complex vitamins capsule Take 1 capsule by mouth daily. 60 capsule 3  . buPROPion (WELLBUTRIN XL) 150 MG 24 hr tablet Take 150 mg by mouth.    . cetirizine (ZYRTEC) 10 MG tablet Take 10 mg by mouth daily as needed for allergies.     . clindamycin (CLINDAGEL) 1 % gel APP TO ACNE  QHS  2  . ferrous sulfate 325 (65 FE) MG EC tablet Take 1 tablet (325 mg total) by mouth daily with breakfast.    . FOLIC ACID PO Take by mouth.    . loperamide (IMODIUM) 2 MG capsule Take 2 mg by mouth daily as needed for diarrhea or loose stools.    Kathleen Kitchen METRONIDAZOLE, TOPICAL, 0.75 % LOTN APP ONTO THE SKIN ONCE D UTD  2  . ondansetron (ZOFRAN ODT) 4 MG disintegrating tablet Take 1 tablet (4 mg total) by mouth every 4 (four) hours as needed for nausea or vomiting. 20 tablet 0  . pantoprazole (PROTONIX) 40 MG tablet Take 40 mg by mouth at bedtime.     Kathleen Kitchen  topiramate (TOPAMAX) 50 MG tablet Take 50 mg by mouth 2 (two) times daily.    . vitamin B-12 (CYANOCOBALAMIN) 1000 MCG tablet Take 500 mcg by mouth.     . Vitamin D, Ergocalciferol, (DRISDOL) 50000 units CAPS capsule TAKE ONE CAPSULE BY MOUTH 2 TIMES A WEEK 24 capsule 0   No current facility-administered medications for this visit.     REVIEW OF SYSTEMS:    10 Point review of Systems was done is negative except as noted above.  PHYSICAL EXAMINATION: ECOG PERFORMANCE STATUS: 1 - Symptomatic but completely ambulatory  . Vitals:   07/24/16 0847  BP: 121/80  Pulse: 81  Resp: 18  Temp: 98.7 F (37.1 C)   Filed Weights   07/24/16 0847  Weight: 178 lb 9.6 oz (81 kg)   .Body mass index is 28.83 kg/m.  GENERAL:alert, in no acute distress and comfortable SKIN: skin color, texture, turgor are normal, no rashes or significant  lesions EYES: normal, conjunctiva are pink and non-injected, sclera clear OROPHARYNX:no exudate, no erythema and lips, buccal mucosa, and tongue normal  NECK: supple, no JVD, thyroid normal size, non-tender, without nodularity LYMPH:  no palpable lymphadenopathy in the cervical, axillary or inguinal LUNGS: clear to auscultation with normal respiratory effort HEART: regular rate & rhythm,  no murmurs and no lower extremity edema ABDOMEN: abdomen soft, non-tender, normoactive bowel sounds , healed surgical scars Musculoskeletal: no cyanosis of digits and no clubbing  PSYCH: alert & oriented x 3 with fluent speech NEURO: no focal motor/sensory deficits  LABORATORY DATA:  I have reviewed the data as listed  . CBC Latest Ref Rng & Units 07/24/2016 03/24/2016 12/27/2015  WBC 3.9 - 10.3 10e3/uL 8.3 8.3 8.7  Hemoglobin 11.6 - 15.9 g/dL 40.914.2 81.114.2 91.414.0  Hematocrit 34.8 - 46.6 % 41.4 41.2 41.3  Platelets 145 - 400 10e3/uL 256 265 247   . CBC    Component Value Date/Time   WBC 8.3 07/24/2016 0834   WBC 6.7 06/24/2015 1335   RBC 4.63 07/24/2016 0834   RBC 4.64 06/24/2015 1335   HGB 14.2 07/24/2016 0834   HCT 41.4 07/24/2016 0834   PLT 256 07/24/2016 0834   MCV 89.4 07/24/2016 0834   MCH 30.7 07/24/2016 0834   MCH 24.4 (L) 06/24/2015 1335   MCHC 34.3 07/24/2016 0834   MCHC 31.7 06/24/2015 1335   RDW 12.2 07/24/2016 0834   LYMPHSABS 1.8 07/24/2016 0834   MONOABS 0.6 07/24/2016 0834   EOSABS 0.6 (H) 07/24/2016 0834   BASOSABS 0.0 07/24/2016 0834     CMP Latest Ref Rng & Units 07/24/2016 03/24/2016 12/27/2015  Glucose 70 - 140 mg/dl 99 96 782115  BUN 7.0 - 95.626.0 mg/dL 9.1 9.3 8.8  Creatinine 0.6 - 1.1 mg/dL 0.8 0.8 0.9  Sodium 213136 - 145 mEq/L 142 142 142  Potassium 3.5 - 5.1 mEq/L 4.3 3.9 3.9  Chloride 101 - 111 mmol/L - - -  CO2 22 - 29 mEq/L 24 25 21(L)  Calcium 8.4 - 10.4 mg/dL 9.8 9.5 9.7  Total Protein 6.4 - 8.3 g/dL 7.5 7.5 7.8  Total Bilirubin 0.20 - 1.20 mg/dL 0.860.43 5.780.41 4.690.35   Alkaline Phos 40 - 150 U/L 76 89 84  AST 5 - 34 U/L 14 21 19   ALT 0 - 55 U/L 16 28 25    . Lab Results  Component Value Date   IRON 105 03/24/2016   TIBC 354 03/24/2016   IRONPCTSAT 30 03/24/2016   (Iron and TIBC)  Lab Results  Component Value Date   FERRITIN 160 03/24/2016   B12 - 198-->-933---> 1408-->832  25 OH vit D 10-->23.8-->16.4    RADIOGRAPHIC STUDIES: I have personally reviewed the radiological images as listed and agreed with the findings in the report. No results found.  ASSESSMENT & PLAN:    34 yo female with severe ulcerative colitis s/p total colectomy due to intractable disease  1) Iron deficiency with mild anemia due to --blood loss from ulcerative colitis + surgeries + poor absorption due to PPI and multiple colon surgeries. Iron deficiency and anemia resolved with IV iron.  Patient's hemoglobin is stable at 14.2 with a normal MCV suggesting no significant iron deficiency at this time. 2) b12 deficiency due to absorption issues. Resolved with  B12 replacement.  3) Vit D deficiency due to absorption issues - still low on ergocalciferol 50,000 units twice weekly as per primary care physician Plan -We'll follow up on her pending ferritin level at this time. CBC is stable and we do not anticipate significant iron deficiency currently. -continue iron polysacchar 150 mg by mouth daily with orange juice for iron maintenance. -Continue B12 SL daily +  B complex due to the likelihood of other co-existing B vit deficiencies -Ergocalciferol  Increase to 50k units twice qweekly and follow-up regarding further treatments with primary care physician .  RTC with Dr Candise Che in 6 months with rpt cbc, cmp, ferritin, B12  All of the patients questions were answered to her apparent satisfaction. The patient knows to call the clinic with any problems, questions or concerns.  Wyvonnia Lora MD MS AAHIVMS [facility] Hematology/Oncology Physician [facility]  (Office):       [phone] (Work cell):  [phone] (Fax):           [phone]

## 2016-07-25 LAB — VITAMIN B12

## 2016-10-16 ENCOUNTER — Other Ambulatory Visit: Payer: Self-pay | Admitting: Hematology

## 2016-10-16 DIAGNOSIS — E559 Vitamin D deficiency, unspecified: Secondary | ICD-10-CM

## 2016-10-21 ENCOUNTER — Other Ambulatory Visit: Payer: Self-pay | Admitting: Hematology

## 2016-10-21 DIAGNOSIS — E559 Vitamin D deficiency, unspecified: Secondary | ICD-10-CM

## 2017-01-19 ENCOUNTER — Other Ambulatory Visit: Payer: Self-pay | Admitting: *Deleted

## 2017-01-19 DIAGNOSIS — D649 Anemia, unspecified: Secondary | ICD-10-CM

## 2017-01-22 ENCOUNTER — Ambulatory Visit: Payer: Commercial Managed Care - HMO | Admitting: Hematology

## 2017-01-22 ENCOUNTER — Other Ambulatory Visit: Payer: Commercial Managed Care - HMO

## 2017-01-22 ENCOUNTER — Telehealth: Payer: Self-pay | Admitting: Hematology

## 2017-01-22 NOTE — Telephone Encounter (Signed)
Pt called to cxl today's appt and will r/s at later date

## 2017-12-31 DIAGNOSIS — Z01419 Encounter for gynecological examination (general) (routine) without abnormal findings: Secondary | ICD-10-CM | POA: Diagnosis not present

## 2017-12-31 DIAGNOSIS — N72 Inflammatory disease of cervix uteri: Secondary | ICD-10-CM | POA: Diagnosis not present

## 2018-01-21 DIAGNOSIS — L709 Acne, unspecified: Secondary | ICD-10-CM | POA: Diagnosis not present

## 2018-01-21 DIAGNOSIS — Z79899 Other long term (current) drug therapy: Secondary | ICD-10-CM | POA: Diagnosis not present

## 2018-02-21 DIAGNOSIS — Z5181 Encounter for therapeutic drug level monitoring: Secondary | ICD-10-CM | POA: Diagnosis not present

## 2018-02-21 DIAGNOSIS — L709 Acne, unspecified: Secondary | ICD-10-CM | POA: Diagnosis not present

## 2018-03-25 DIAGNOSIS — L709 Acne, unspecified: Secondary | ICD-10-CM | POA: Diagnosis not present

## 2018-03-25 DIAGNOSIS — Z5181 Encounter for therapeutic drug level monitoring: Secondary | ICD-10-CM | POA: Diagnosis not present

## 2018-04-26 DIAGNOSIS — L709 Acne, unspecified: Secondary | ICD-10-CM | POA: Diagnosis not present

## 2018-04-26 DIAGNOSIS — Z5181 Encounter for therapeutic drug level monitoring: Secondary | ICD-10-CM | POA: Diagnosis not present

## 2018-04-26 DIAGNOSIS — L700 Acne vulgaris: Secondary | ICD-10-CM | POA: Diagnosis not present

## 2018-05-22 DIAGNOSIS — K210 Gastro-esophageal reflux disease with esophagitis: Secondary | ICD-10-CM | POA: Diagnosis not present

## 2018-05-22 DIAGNOSIS — E782 Mixed hyperlipidemia: Secondary | ICD-10-CM | POA: Diagnosis not present

## 2018-05-27 DIAGNOSIS — L709 Acne, unspecified: Secondary | ICD-10-CM | POA: Diagnosis not present

## 2018-05-27 DIAGNOSIS — K130 Diseases of lips: Secondary | ICD-10-CM | POA: Diagnosis not present

## 2018-05-27 DIAGNOSIS — Z5181 Encounter for therapeutic drug level monitoring: Secondary | ICD-10-CM | POA: Diagnosis not present

## 2018-06-12 DIAGNOSIS — Z01812 Encounter for preprocedural laboratory examination: Secondary | ICD-10-CM | POA: Diagnosis not present

## 2018-06-12 DIAGNOSIS — Z1159 Encounter for screening for other viral diseases: Secondary | ICD-10-CM | POA: Diagnosis not present

## 2018-06-27 DIAGNOSIS — Z5181 Encounter for therapeutic drug level monitoring: Secondary | ICD-10-CM | POA: Diagnosis not present

## 2018-06-27 DIAGNOSIS — L700 Acne vulgaris: Secondary | ICD-10-CM | POA: Diagnosis not present

## 2018-06-27 DIAGNOSIS — K130 Diseases of lips: Secondary | ICD-10-CM | POA: Diagnosis not present

## 2018-06-27 DIAGNOSIS — L709 Acne, unspecified: Secondary | ICD-10-CM | POA: Diagnosis not present

## 2018-07-31 DIAGNOSIS — Z5181 Encounter for therapeutic drug level monitoring: Secondary | ICD-10-CM | POA: Diagnosis not present

## 2018-07-31 DIAGNOSIS — K130 Diseases of lips: Secondary | ICD-10-CM | POA: Diagnosis not present

## 2018-07-31 DIAGNOSIS — L709 Acne, unspecified: Secondary | ICD-10-CM | POA: Diagnosis not present

## 2018-09-02 DIAGNOSIS — L858 Other specified epidermal thickening: Secondary | ICD-10-CM | POA: Diagnosis not present

## 2018-09-02 DIAGNOSIS — Z23 Encounter for immunization: Secondary | ICD-10-CM | POA: Diagnosis not present

## 2018-09-02 DIAGNOSIS — L700 Acne vulgaris: Secondary | ICD-10-CM | POA: Diagnosis not present

## 2018-10-03 DIAGNOSIS — L700 Acne vulgaris: Secondary | ICD-10-CM | POA: Diagnosis not present

## 2018-10-03 DIAGNOSIS — Z79899 Other long term (current) drug therapy: Secondary | ICD-10-CM | POA: Diagnosis not present

## 2018-10-08 DIAGNOSIS — E781 Pure hyperglyceridemia: Secondary | ICD-10-CM | POA: Diagnosis not present

## 2018-10-08 DIAGNOSIS — R52 Pain, unspecified: Secondary | ICD-10-CM | POA: Diagnosis not present

## 2018-11-05 DIAGNOSIS — L700 Acne vulgaris: Secondary | ICD-10-CM | POA: Diagnosis not present

## 2018-11-05 DIAGNOSIS — Z79899 Other long term (current) drug therapy: Secondary | ICD-10-CM | POA: Diagnosis not present

## 2018-12-09 DIAGNOSIS — Z79899 Other long term (current) drug therapy: Secondary | ICD-10-CM | POA: Diagnosis not present

## 2018-12-09 DIAGNOSIS — L700 Acne vulgaris: Secondary | ICD-10-CM | POA: Diagnosis not present

## 2019-01-13 DIAGNOSIS — Z79899 Other long term (current) drug therapy: Secondary | ICD-10-CM | POA: Diagnosis not present

## 2019-01-13 DIAGNOSIS — L700 Acne vulgaris: Secondary | ICD-10-CM | POA: Diagnosis not present

## 2019-02-12 DIAGNOSIS — L700 Acne vulgaris: Secondary | ICD-10-CM | POA: Diagnosis not present

## 2019-04-18 DIAGNOSIS — Z6827 Body mass index (BMI) 27.0-27.9, adult: Secondary | ICD-10-CM | POA: Diagnosis not present

## 2019-04-18 DIAGNOSIS — Z01419 Encounter for gynecological examination (general) (routine) without abnormal findings: Secondary | ICD-10-CM | POA: Diagnosis not present

## 2019-04-18 DIAGNOSIS — E282 Polycystic ovarian syndrome: Secondary | ICD-10-CM | POA: Diagnosis not present

## 2021-02-10 DIAGNOSIS — D509 Iron deficiency anemia, unspecified: Secondary | ICD-10-CM | POA: Diagnosis not present

## 2021-02-10 DIAGNOSIS — E611 Iron deficiency: Secondary | ICD-10-CM | POA: Diagnosis not present

## 2021-05-13 DIAGNOSIS — E611 Iron deficiency: Secondary | ICD-10-CM | POA: Diagnosis not present

## 2021-05-16 DIAGNOSIS — N979 Female infertility, unspecified: Secondary | ICD-10-CM | POA: Diagnosis not present

## 2021-06-02 DIAGNOSIS — N979 Female infertility, unspecified: Secondary | ICD-10-CM | POA: Diagnosis not present

## 2021-06-17 DIAGNOSIS — Z9851 Tubal ligation status: Secondary | ICD-10-CM | POA: Diagnosis not present

## 2021-06-17 DIAGNOSIS — Z01411 Encounter for gynecological examination (general) (routine) with abnormal findings: Secondary | ICD-10-CM | POA: Diagnosis not present

## 2021-06-17 DIAGNOSIS — Z862 Personal history of diseases of the blood and blood-forming organs and certain disorders involving the immune mechanism: Secondary | ICD-10-CM | POA: Diagnosis not present

## 2021-06-17 DIAGNOSIS — N898 Other specified noninflammatory disorders of vagina: Secondary | ICD-10-CM | POA: Diagnosis not present

## 2021-06-20 DIAGNOSIS — N979 Female infertility, unspecified: Secondary | ICD-10-CM | POA: Diagnosis not present

## 2021-06-30 DIAGNOSIS — N979 Female infertility, unspecified: Secondary | ICD-10-CM | POA: Diagnosis not present

## 2021-07-07 DIAGNOSIS — N979 Female infertility, unspecified: Secondary | ICD-10-CM | POA: Diagnosis not present

## 2021-07-22 DIAGNOSIS — F3289 Other specified depressive episodes: Secondary | ICD-10-CM | POA: Diagnosis not present

## 2021-07-25 DIAGNOSIS — Z3201 Encounter for pregnancy test, result positive: Secondary | ICD-10-CM | POA: Diagnosis not present

## 2021-07-25 DIAGNOSIS — N925 Other specified irregular menstruation: Secondary | ICD-10-CM | POA: Diagnosis not present

## 2021-07-25 DIAGNOSIS — N979 Female infertility, unspecified: Secondary | ICD-10-CM | POA: Diagnosis not present

## 2021-08-12 DIAGNOSIS — D509 Iron deficiency anemia, unspecified: Secondary | ICD-10-CM | POA: Diagnosis not present

## 2021-08-12 DIAGNOSIS — D500 Iron deficiency anemia secondary to blood loss (chronic): Secondary | ICD-10-CM | POA: Diagnosis not present

## 2021-09-27 DIAGNOSIS — N979 Female infertility, unspecified: Secondary | ICD-10-CM | POA: Diagnosis not present

## 2021-10-04 DIAGNOSIS — N979 Female infertility, unspecified: Secondary | ICD-10-CM | POA: Diagnosis not present

## 2021-10-11 DIAGNOSIS — N979 Female infertility, unspecified: Secondary | ICD-10-CM | POA: Diagnosis not present

## 2021-10-27 DIAGNOSIS — N979 Female infertility, unspecified: Secondary | ICD-10-CM | POA: Diagnosis not present

## 2021-10-27 DIAGNOSIS — Z3201 Encounter for pregnancy test, result positive: Secondary | ICD-10-CM | POA: Diagnosis not present

## 2021-10-27 DIAGNOSIS — N925 Other specified irregular menstruation: Secondary | ICD-10-CM | POA: Diagnosis not present

## 2021-11-15 DIAGNOSIS — U071 COVID-19: Secondary | ICD-10-CM | POA: Diagnosis not present

## 2021-12-29 DIAGNOSIS — Z8719 Personal history of other diseases of the digestive system: Secondary | ICD-10-CM | POA: Diagnosis not present

## 2021-12-29 DIAGNOSIS — Z9049 Acquired absence of other specified parts of digestive tract: Secondary | ICD-10-CM | POA: Diagnosis not present

## 2022-02-08 DIAGNOSIS — Z8719 Personal history of other diseases of the digestive system: Secondary | ICD-10-CM | POA: Diagnosis not present

## 2022-02-08 DIAGNOSIS — K529 Noninfective gastroenteritis and colitis, unspecified: Secondary | ICD-10-CM | POA: Diagnosis not present

## 2022-02-08 DIAGNOSIS — K921 Melena: Secondary | ICD-10-CM | POA: Diagnosis not present

## 2022-02-08 DIAGNOSIS — Z9049 Acquired absence of other specified parts of digestive tract: Secondary | ICD-10-CM | POA: Diagnosis not present

## 2022-02-08 DIAGNOSIS — K91850 Pouchitis: Secondary | ICD-10-CM | POA: Diagnosis not present

## 2022-03-23 DIAGNOSIS — Z8719 Personal history of other diseases of the digestive system: Secondary | ICD-10-CM | POA: Diagnosis not present

## 2022-03-23 DIAGNOSIS — K91850 Pouchitis: Secondary | ICD-10-CM | POA: Diagnosis not present

## 2022-03-23 DIAGNOSIS — Z9049 Acquired absence of other specified parts of digestive tract: Secondary | ICD-10-CM | POA: Diagnosis not present

## 2022-07-11 DIAGNOSIS — Z0000 Encounter for general adult medical examination without abnormal findings: Secondary | ICD-10-CM | POA: Diagnosis not present

## 2022-07-11 DIAGNOSIS — Z23 Encounter for immunization: Secondary | ICD-10-CM | POA: Diagnosis not present

## 2022-07-11 DIAGNOSIS — R7303 Prediabetes: Secondary | ICD-10-CM | POA: Diagnosis not present

## 2022-07-11 DIAGNOSIS — Z13220 Encounter for screening for lipoid disorders: Secondary | ICD-10-CM | POA: Diagnosis not present

## 2022-07-11 DIAGNOSIS — N201 Calculus of ureter: Secondary | ICD-10-CM | POA: Diagnosis not present

## 2022-09-04 DIAGNOSIS — N979 Female infertility, unspecified: Secondary | ICD-10-CM | POA: Diagnosis not present

## 2022-09-11 DIAGNOSIS — N979 Female infertility, unspecified: Secondary | ICD-10-CM | POA: Diagnosis not present

## 2022-09-14 DIAGNOSIS — K91850 Pouchitis: Secondary | ICD-10-CM | POA: Diagnosis not present

## 2022-09-26 DIAGNOSIS — Z23 Encounter for immunization: Secondary | ICD-10-CM | POA: Diagnosis not present

## 2022-09-26 DIAGNOSIS — R519 Headache, unspecified: Secondary | ICD-10-CM | POA: Diagnosis not present

## 2022-10-09 DIAGNOSIS — N979 Female infertility, unspecified: Secondary | ICD-10-CM | POA: Diagnosis not present

## 2022-10-18 DIAGNOSIS — N979 Female infertility, unspecified: Secondary | ICD-10-CM | POA: Diagnosis not present

## 2022-10-27 DIAGNOSIS — N979 Female infertility, unspecified: Secondary | ICD-10-CM | POA: Diagnosis not present

## 2022-11-06 DIAGNOSIS — G43719 Chronic migraine without aura, intractable, without status migrainosus: Secondary | ICD-10-CM | POA: Diagnosis not present

## 2022-11-06 DIAGNOSIS — Z3A01 Less than 8 weeks gestation of pregnancy: Secondary | ICD-10-CM | POA: Diagnosis not present

## 2023-01-18 DIAGNOSIS — N979 Female infertility, unspecified: Secondary | ICD-10-CM | POA: Diagnosis not present

## 2023-04-10 DIAGNOSIS — J069 Acute upper respiratory infection, unspecified: Secondary | ICD-10-CM | POA: Diagnosis not present

## 2023-06-04 DIAGNOSIS — Z01419 Encounter for gynecological examination (general) (routine) without abnormal findings: Secondary | ICD-10-CM | POA: Diagnosis not present

## 2023-06-04 DIAGNOSIS — Z1151 Encounter for screening for human papillomavirus (HPV): Secondary | ICD-10-CM | POA: Diagnosis not present

## 2023-06-06 DIAGNOSIS — N971 Female infertility of tubal origin: Secondary | ICD-10-CM | POA: Diagnosis not present

## 2023-06-06 DIAGNOSIS — E282 Polycystic ovarian syndrome: Secondary | ICD-10-CM | POA: Diagnosis not present

## 2023-06-06 DIAGNOSIS — N979 Female infertility, unspecified: Secondary | ICD-10-CM | POA: Diagnosis not present

## 2023-06-06 DIAGNOSIS — N970 Female infertility associated with anovulation: Secondary | ICD-10-CM | POA: Diagnosis not present

## 2023-06-07 DIAGNOSIS — Z3183 Encounter for assisted reproductive fertility procedure cycle: Secondary | ICD-10-CM | POA: Diagnosis not present

## 2023-06-07 DIAGNOSIS — Z3181 Encounter for male factor infertility in female patient: Secondary | ICD-10-CM | POA: Diagnosis not present

## 2023-06-07 DIAGNOSIS — N979 Female infertility, unspecified: Secondary | ICD-10-CM | POA: Diagnosis not present

## 2023-06-07 DIAGNOSIS — N970 Female infertility associated with anovulation: Secondary | ICD-10-CM | POA: Diagnosis not present

## 2023-07-17 DIAGNOSIS — Z0000 Encounter for general adult medical examination without abnormal findings: Secondary | ICD-10-CM | POA: Diagnosis not present

## 2023-07-26 DIAGNOSIS — Z7251 High risk heterosexual behavior: Secondary | ICD-10-CM | POA: Diagnosis not present

## 2023-07-26 DIAGNOSIS — N898 Other specified noninflammatory disorders of vagina: Secondary | ICD-10-CM | POA: Diagnosis not present

## 2023-08-10 DIAGNOSIS — R7301 Impaired fasting glucose: Secondary | ICD-10-CM | POA: Diagnosis not present

## 2023-08-10 DIAGNOSIS — Z0000 Encounter for general adult medical examination without abnormal findings: Secondary | ICD-10-CM | POA: Diagnosis not present

## 2023-08-10 DIAGNOSIS — Z8639 Personal history of other endocrine, nutritional and metabolic disease: Secondary | ICD-10-CM | POA: Diagnosis not present

## 2023-08-10 DIAGNOSIS — Z13220 Encounter for screening for lipoid disorders: Secondary | ICD-10-CM | POA: Diagnosis not present

## 2023-10-11 DIAGNOSIS — E782 Mixed hyperlipidemia: Secondary | ICD-10-CM | POA: Diagnosis not present

## 2023-10-11 DIAGNOSIS — Z79899 Other long term (current) drug therapy: Secondary | ICD-10-CM | POA: Diagnosis not present

## 2023-10-11 DIAGNOSIS — R7303 Prediabetes: Secondary | ICD-10-CM | POA: Diagnosis not present

## 2023-11-06 DIAGNOSIS — K91850 Pouchitis: Secondary | ICD-10-CM | POA: Diagnosis not present

## 2023-11-16 DIAGNOSIS — M25521 Pain in right elbow: Secondary | ICD-10-CM | POA: Diagnosis not present

## 2023-11-16 DIAGNOSIS — M7711 Lateral epicondylitis, right elbow: Secondary | ICD-10-CM | POA: Diagnosis not present

## 2023-11-20 DIAGNOSIS — J029 Acute pharyngitis, unspecified: Secondary | ICD-10-CM | POA: Diagnosis not present

## 2023-12-24 ENCOUNTER — Encounter: Payer: Self-pay | Admitting: Hematology

## 2024-01-07 DIAGNOSIS — M7711 Lateral epicondylitis, right elbow: Secondary | ICD-10-CM | POA: Diagnosis not present

## 2024-01-15 DIAGNOSIS — N96 Recurrent pregnancy loss: Secondary | ICD-10-CM | POA: Diagnosis not present

## 2024-01-15 DIAGNOSIS — Z3169 Encounter for other general counseling and advice on procreation: Secondary | ICD-10-CM | POA: Diagnosis not present

## 2024-01-18 DIAGNOSIS — Z3141 Encounter for fertility testing: Secondary | ICD-10-CM | POA: Diagnosis not present

## 2024-01-18 DIAGNOSIS — N96 Recurrent pregnancy loss: Secondary | ICD-10-CM | POA: Diagnosis not present

## 2024-01-22 DIAGNOSIS — Z79899 Other long term (current) drug therapy: Secondary | ICD-10-CM | POA: Diagnosis not present

## 2024-01-22 DIAGNOSIS — E782 Mixed hyperlipidemia: Secondary | ICD-10-CM | POA: Diagnosis not present

## 2024-01-24 ENCOUNTER — Encounter: Payer: Self-pay | Admitting: Behavioral Health

## 2024-01-24 ENCOUNTER — Ambulatory Visit (INDEPENDENT_AMBULATORY_CARE_PROVIDER_SITE_OTHER): Payer: BC Managed Care – PPO | Admitting: Behavioral Health

## 2024-01-24 VITALS — BP 136/83 | HR 101 | Ht 64.0 in | Wt 152.0 lb

## 2024-01-24 DIAGNOSIS — F331 Major depressive disorder, recurrent, moderate: Secondary | ICD-10-CM | POA: Diagnosis not present

## 2024-01-24 DIAGNOSIS — Z9189 Other specified personal risk factors, not elsewhere classified: Secondary | ICD-10-CM | POA: Diagnosis not present

## 2024-01-24 DIAGNOSIS — F411 Generalized anxiety disorder: Secondary | ICD-10-CM

## 2024-01-24 MED ORDER — VILAZODONE HCL 20 MG PO TABS: 20.0000 mg | ORAL_TABLET | Freq: Every day | ORAL | 1 refills | Status: DC

## 2024-01-24 NOTE — Progress Notes (Signed)
 [facility] MD/PA/NP Initial Note  01/24/2024 4:11 PM Kathleen Mcdonald  MRN:  [id number]  Chief Complaint:  Chief Complaint   Depression; Anxiety; Follow-up; Medication Refill; Patient Education     HPI:   "Kathleen Mcdonald", 43 year old female presents to this office for initial visit and to establish care.  Collateral information should be considered reliable.  Patient states that she has been a patient of Kathleen Mcdonald for therapy in the past.  States that she is here today to reassess her current antidepressant medication.  Says that she first started taking a medication for anxiety and depression in 2016 and it worked well for a while.  She eventually stopped taking the medication but spoke to her PCP about returning symptoms in 2021.  She initiated sertraline 50 mg which has worked well for anxiety and depression but she is frustrated with the medication leaving her " numb and dull".  She would like to consider another medication that may have less of this undesirable effect.  Also reports that she has experienced some sexual side effects with sertraline.  She endorses increased irritability at times, racing thoughts, trouble concentrating.  Reports a history of risky or foolish behavior in the past.  She is currently married with 1 young child at home.  Says that she does feel overwhelmed with stress and anxiety due to having to assist with care for blind husband.  Her child is going to be tested for autism.  Says that she lacks time for self-care.  She does work full-time from home and reports not getting many breaks.  She reports anxiety today at 6/10, and depression at 3/10.  She is sleeping 7 to 8 hours per night.  She denies history of mania, no psychosis, no auditory or visual hallucinations.  Denies SI or HI.  Patient states that she feels safe and verbally contracts for safety with this Clinical research associate.  Past psychiatric medication trials: Wellbutrin Sertraline   Visit Diagnosis:    ICD-10-CM   1.  Major depressive disorder, recurrent episode, moderate (HCC)  F33.1 Vilazodone HCl 20 MG TABS    2. Generalized anxiety disorder  F41.1 Vilazodone HCl 20 MG TABS    3. At high risk for caregiver role strain  Z91.89       Past Psychiatric History: anxiety, MDD  Past Medical History:  Past Medical History:  Diagnosis Date   Depression    using celexa   Hypertension    Iatrogenic Cushing's disease    Irregular heart beat    OSA (obstructive sleep apnea)    using CPAP   PCOS (polycystic ovarian syndrome)    Ulcerative colitis (HCC)     Past Surgical History:  Procedure Laterality Date   COLECTOMY  11/2014   for ulcerative colitis   colon repair  04/2015   WISDOM TOOTH EXTRACTION      Family Psychiatric History: see chart  Family History:  Family History  Problem Relation Age of Onset   Schizophrenia Paternal Aunt    Drug abuse Paternal Aunt    Cervical cancer Maternal Uncle    Drug abuse Paternal Uncle    Alcohol abuse Paternal Uncle    Colon cancer Maternal Grandfather    Cervical cancer Maternal Grandmother    Colon cancer Paternal Grandfather    Colon cancer Paternal Grandmother    Drug abuse Cousin    Alcohol abuse Cousin    Anesthesia problems Neg Hx     Social History:  Social History   Socioeconomic History  Marital status: Married    Spouse name: Kathleen Mcdonald   Number of children: 1   Years of education: 16   Highest education level: Bachelor's degree (e.g., BA, AB, BS)  Occupational History   Not on file  Tobacco Use   Smoking status: Former    Current packs/day: 0.00    Types: Cigarettes    Quit date: 11/28/2011    Years since quitting: 12.1   Smokeless tobacco: Never  Substance and Sexual Activity   Alcohol use: Yes    Comment: occas., once a month or less.   Drug use: No   Sexual activity: Yes  Other Topics Concern   Not on file  Social History Narrative   Lives in [place] with husband and young children.    Social Drivers of Manufacturing engineer Strain: Not on file  Food Insecurity: No Food Insecurity (04/15/2020)   Received from [facility] visits prior to 01/27/2023.   Hunger Vital Sign    Worried About Running Out of Food in the Last Year: Never true    Ran Out of Food in the Last Year: Never true  Transportation Needs: Not on file  Physical Activity: Not on file  Stress: Not on file  Social Connections: Not on file    Allergies:  Allergies  Allergen Reactions   Chocolate Anaphylaxis   Latex Hives and Swelling   Nickel Hives    Metabolic Disorder Labs: No results found for: "HGBA1C", "MPG" No results found for: "PROLACTIN" No results found for: "CHOL", "TRIG", "HDL", "CHOLHDL", "VLDL", "LDLCALC" No results found for: "TSH"  Therapeutic Level Labs: No results found for: "LITHIUM" No results found for: "VALPROATE" No results found for: "CBMZ"  Current Medications: Current Outpatient Medications  Medication Sig Dispense Refill   Vilazodone HCl 20 MG TABS Take 1 tablet (20 mg total) by mouth daily after breakfast. 30 tablet 1   b complex vitamins capsule Take 1 capsule by mouth daily. 60 capsule 3   buPROPion (WELLBUTRIN XL) 150 MG 24 hr tablet Take 150 mg by mouth.     cetirizine (ZYRTEC) 10 MG tablet Take 10 mg by mouth daily as needed for allergies.      clindamycin (CLINDAGEL) 1 % gel APP TO ACNE  QHS  2   ferrous sulfate 325 (65 FE) MG EC tablet Take 1 tablet (325 mg total) by mouth daily with breakfast.     FOLIC ACID PO Take by mouth.     loperamide (IMODIUM) 2 MG capsule Take 2 mg by mouth daily as needed for diarrhea or loose stools.     METRONIDAZOLE, TOPICAL, 0.75 % LOTN APP ONTO THE SKIN ONCE D UTD  2   ondansetron (ZOFRAN ODT) 4 MG disintegrating tablet Take 1 tablet (4 mg total) by mouth every 4 (four) hours as needed for nausea or vomiting. 20 tablet 0   pantoprazole (PROTONIX) 40 MG tablet Take 40 mg by mouth at bedtime.      topiramate (TOPAMAX) 50 MG  tablet Take 50 mg by mouth 2 (two) times daily.     vitamin B-12 (CYANOCOBALAMIN) 1000 MCG tablet Take 500 mcg by mouth.      Vitamin D, Ergocalciferol, (DRISDOL) 50000 units CAPS capsule TAKE ONE CAPSULE BY MOUTH 2 TIMES A WEEK 24 capsule 0   No current facility-administered medications for this visit.    Medication Side Effects: none  Orders placed this visit:  No orders of the defined types were placed in  this encounter.   Psychiatric Specialty Exam:  Review of Systems  Constitutional: Negative.   Allergic/Immunologic: Negative.   Neurological: Negative.   Psychiatric/Behavioral:  Positive for dysphoric mood. The patient is nervous/anxious.     Blood pressure 136/83, pulse (!) 101, height 5\' 4"  (1.626 m), weight 152 lb (68.9 kg).Body mass index is 26.09 kg/m.  General Appearance: Casual, Neat, and Well Groomed  Eye Contact:  Good  Speech:  Clear and Coherent  Volume:  Normal  Mood:  NA  Affect:  Appropriate  Thought Process:  Coherent  Orientation:  Full (Time, Place, and Person)  Thought Content: Logical   Suicidal Thoughts:  No  Homicidal Thoughts:  No  Memory:  WNL  Judgement:  Good  Insight:  Good  Psychomotor Activity:  Normal  Concentration:  Concentration: Good  Recall:  Good  Fund of Knowledge: Good  Language: Good  Assets:  Desire for Improvement  ADL's:  Intact  Cognition: WNL  Prognosis:  Good   Screenings:  PHQ2-9    Flowsheet Row Office Visit from 01/24/2024 in [facility]  PHQ-2 Total Score 1        Receiving Psychotherapy: no, not currently  Treatment Plan/Recommendations:   Greater than 50% of 60 min face to face time with patient was spent on counseling and coordination of care. We discussed her long history of anxiety and depression stemming back to 2016.  Discussed her previous plan of care with PCP and past medication trials.  Talked about side effects with medication and Zoloft causing flat affect.  We  reviewed other medication options as well as possible side effect profiles.  We also had candid discussion about her high risk for caregiver role strain.  Reinforced the importance of her taking time for self-care.  Discussed her goals for care at this practice.  She is considering possible in vitro fertilization and becoming pregnant at some point.  We discussed risk factor with SSRIs.  She understands that she would need to notify her OB/GYN and this office immediately if becoming pregnant to discuss risk and safer medications.  Currently not on [facility] and having regular periods.  We agreed to: Will reduce Zoloft to 25 mg for 1 week and then stop medication. Will start Viibryd 10 mg for 1 week and then 20 mg daily.  She has a history of bowel surgery and will need to cut tablet into smaller pieces.  It is not recommended that she crush tablet. Will report worsening symptoms or side effects promptly Will follow-up in 4 weeks to reassess Provided emergency contact information Reviewed PDMP      Joan Flores, NP

## 2024-02-15 ENCOUNTER — Other Ambulatory Visit: Payer: Self-pay | Admitting: Behavioral Health

## 2024-02-15 DIAGNOSIS — F411 Generalized anxiety disorder: Secondary | ICD-10-CM

## 2024-02-15 DIAGNOSIS — F331 Major depressive disorder, recurrent, moderate: Secondary | ICD-10-CM

## 2024-02-21 ENCOUNTER — Ambulatory Visit: Payer: BC Managed Care – PPO | Admitting: Behavioral Health

## 2024-02-21 ENCOUNTER — Encounter: Payer: Self-pay | Admitting: Behavioral Health

## 2024-02-21 DIAGNOSIS — F411 Generalized anxiety disorder: Secondary | ICD-10-CM

## 2024-02-21 DIAGNOSIS — F331 Major depressive disorder, recurrent, moderate: Secondary | ICD-10-CM | POA: Diagnosis not present

## 2024-02-21 DIAGNOSIS — Z9189 Other specified personal risk factors, not elsewhere classified: Secondary | ICD-10-CM

## 2024-02-21 MED ORDER — VILAZODONE HCL 20 MG PO TABS: 20.0000 mg | ORAL_TABLET | Freq: Every day | ORAL | 2 refills | Status: AC

## 2024-02-21 NOTE — Progress Notes (Signed)
 [facility] Med Check  Patient ID: Kathleen Mcdonald,  MRN: [id number]  PCP: Henrine Screws, MD  Date of Evaluation: 02/21/2024 Time spent:30 minutes  Chief Complaint:  Chief Complaint   Depression; Anxiety; Follow-up; Medication Refill; Patient Education     HISTORY/CURRENT STATUS: HPI  Kathleen Mcdonald", 43 year old female presents to this office for initial visit and to establish care.  Collateral information should be considered reliable.  She is smiling and pleasant today. Says that so far she is noticing significant positive changes with Viibryd. Very happy with the medicine so far. Says that family members are telling her she is acting like old self.  For now, requesting no medication changes this visit. She reports anxiety today at 2/10, and depression at 2/10.  She is sleeping 7 to 8 hours per night.  She denies history of mania, no psychosis, no auditory or visual hallucinations.  Denies SI or HI.  Patient states that she feels safe and verbally contracts for safety with this Clinical research associate.   Past psychiatric medication trials: Wellbutrin Sertraline  Individual Medical History/ Review of Systems: Changes? :No   Allergies: Chocolate, Latex, and Nickel  Current Medications:  Current Outpatient Medications:    b complex vitamins capsule, Take 1 capsule by mouth daily., Disp: 60 capsule, Rfl: 3   buPROPion (WELLBUTRIN XL) 150 MG 24 hr tablet, Take 150 mg by mouth., Disp: , Rfl:    cetirizine (ZYRTEC) 10 MG tablet, Take 10 mg by mouth daily as needed for allergies. , Disp: , Rfl:    clindamycin (CLINDAGEL) 1 % gel, APP TO ACNE  QHS, Disp: , Rfl: 2   ferrous sulfate 325 (65 FE) MG EC tablet, Take 1 tablet (325 mg total) by mouth daily with breakfast., Disp: , Rfl:    FOLIC ACID PO, Take by mouth., Disp: , Rfl:    loperamide (IMODIUM) 2 MG capsule, Take 2 mg by mouth daily as needed for diarrhea or loose stools., Disp: , Rfl:    METRONIDAZOLE, TOPICAL, 0.75 % LOTN, APP ONTO THE SKIN ONCE  D UTD, Disp: , Rfl: 2   ondansetron (ZOFRAN ODT) 4 MG disintegrating tablet, Take 1 tablet (4 mg total) by mouth every 4 (four) hours as needed for nausea or vomiting., Disp: 20 tablet, Rfl: 0   pantoprazole (PROTONIX) 40 MG tablet, Take 40 mg by mouth at bedtime. , Disp: , Rfl:    topiramate (TOPAMAX) 50 MG tablet, Take 50 mg by mouth 2 (two) times daily., Disp: , Rfl:    Vilazodone HCl 20 MG TABS, Take 1 tablet (20 mg total) by mouth daily after breakfast., Disp: 30 tablet, Rfl: 1   vitamin B-12 (CYANOCOBALAMIN) 1000 MCG tablet, Take 500 mcg by mouth. , Disp: , Rfl:    Vitamin D, Ergocalciferol, (DRISDOL) 50000 units CAPS capsule, TAKE ONE CAPSULE BY MOUTH 2 TIMES A WEEK, Disp: 24 capsule, Rfl: 0 Medication Side Effects: none  Family Medical/ Social History: Changes? No  MENTAL HEALTH EXAM:  There were no vitals taken for this visit.There is no height or weight on file to calculate BMI.  General Appearance: Casual, Neat, and Well Groomed  Eye Contact:  Good  Speech:  Clear and Coherent  Volume:  Normal  Mood:  Negative  Affect:  Appropriate  Thought Process:  Coherent  Orientation:  Full (Time, Place, and Person)  Thought Content: Logical   Suicidal Thoughts:  No  Homicidal Thoughts:  No  Memory:  WNL  Judgement:  Good  Insight:  Good  Psychomotor Activity:  Normal  Concentration:  Concentration: Good  Recall:  Good  Fund of Knowledge: Good  Language: Good  Assets:  Desire for Improvement  ADL's:  Intact  Cognition: WNL  Prognosis:  Good    DIAGNOSES:    ICD-10-CM   1. Generalized anxiety disorder  F41.1     2. Major depressive disorder, recurrent episode, moderate (HCC)  F33.1     3. At high risk for caregiver role strain  Z91.89       Receiving Psychotherapy: No    RECOMMENDATIONS:   Greater than 50% of 30  min face to face time with patient was spent on counseling and coordination of care. Discussed her recent significant improvement since starting the  Viibryd. She is very happy with the medication so far.  We also had candid discussion about her high risk for caregiver role strain.  Reinforced the importance of her taking time for self-care.  Discussed her goals for care at this practice.  She is considering possible in vitro fertilization and becoming pregnant at some point.  We discussed risk factor with SSRIs.  She understands that she would need to notify her OB/GYN and this office immediately if becoming pregnant to discuss risk and safer medications.  Currently not on [facility] and having regular periods.   We agreed to: To continue Viibryd 20 mg daily.  She has a history of bowel surgery and will need to cut tablet into smaller pieces.  It is not recommended that she crush tablet. Will report worsening symptoms or side effects promptly Will follow-up in 8 weeks to reassess Provided emergency contact information Reviewed PDMP     Joan Flores, NP

## 2024-03-18 DIAGNOSIS — Z01812 Encounter for preprocedural laboratory examination: Secondary | ICD-10-CM | POA: Diagnosis not present

## 2024-03-18 DIAGNOSIS — Z0183 Encounter for blood typing: Secondary | ICD-10-CM | POA: Diagnosis not present

## 2024-03-18 DIAGNOSIS — Z113 Encounter for screening for infections with a predominantly sexual mode of transmission: Secondary | ICD-10-CM | POA: Diagnosis not present

## 2024-03-18 DIAGNOSIS — Z1159 Encounter for screening for other viral diseases: Secondary | ICD-10-CM | POA: Diagnosis not present

## 2024-03-31 DIAGNOSIS — E288 Other ovarian dysfunction: Secondary | ICD-10-CM | POA: Diagnosis not present

## 2024-03-31 DIAGNOSIS — N96 Recurrent pregnancy loss: Secondary | ICD-10-CM | POA: Diagnosis not present

## 2024-03-31 DIAGNOSIS — N85A Isthmocele: Secondary | ICD-10-CM | POA: Diagnosis not present

## 2024-03-31 DIAGNOSIS — Z3202 Encounter for pregnancy test, result negative: Secondary | ICD-10-CM | POA: Diagnosis not present

## 2024-03-31 DIAGNOSIS — Z3169 Encounter for other general counseling and advice on procreation: Secondary | ICD-10-CM | POA: Diagnosis not present

## 2024-04-17 ENCOUNTER — Ambulatory Visit (INDEPENDENT_AMBULATORY_CARE_PROVIDER_SITE_OTHER): Admitting: Behavioral Health

## 2024-04-17 ENCOUNTER — Encounter: Payer: Self-pay | Admitting: Behavioral Health

## 2024-04-17 DIAGNOSIS — F331 Major depressive disorder, recurrent, moderate: Secondary | ICD-10-CM | POA: Diagnosis not present

## 2024-04-17 DIAGNOSIS — F411 Generalized anxiety disorder: Secondary | ICD-10-CM | POA: Diagnosis not present

## 2024-04-17 MED ORDER — VILAZODONE HCL 40 MG PO TABS: 40.0000 mg | ORAL_TABLET | Freq: Every day | ORAL | 2 refills | Status: DC

## 2024-04-17 NOTE — Progress Notes (Signed)
 [facility]  Patient ID: Kathleen Mcdonald,  MRN: [id number]  PCP: Ruven Coy, MD  Date of Evaluation: 04/17/2024 Time spent:30 minutes  Chief Complaint:  Chief Complaint   Depression; Family Problem; Anxiety; Follow-up; Medication Refill; Patient Education     HISTORY/CURRENT STATUS: HPI Kathleen Mcdonald", 43 year old female presents to this office for follow up and medication management.  Collateral information should be considered reliable. Says that she has had some decline with depression due to situational things happening with family and work. She discovered she needs surgery again to repair damage from previous C section.  She is tearful saying she feels overwhelmed.  She understands she needs to work on Pharmacologist and that situations will hopefully get better, but wonders if she needs to increase Viibryd . She reports anxiety today at 4/10, and depression at 6/10.  She is sleeping 7 to 8 hours per night.  She denies history of mania, no psychosis, no auditory or visual hallucinations.  Denies SI or HI.  Patient states that she feels safe and verbally contracts for safety with this Clinical research associate.   Past psychiatric medication trials: Wellbutrin Sertraline    Individual Medical History/ Review of Systems: Changes? :No   Allergies: Chocolate, Latex, and Nickel  Current Medications:  Current Outpatient Medications:    Vilazodone  HCl (VIIBRYD ) 40 MG TABS, Take 1 tablet (40 mg total) by mouth daily., Disp: 30 tablet, Rfl: 2   b complex vitamins capsule, Take 1 capsule by mouth daily., Disp: 60 capsule, Rfl: 3   buPROPion (WELLBUTRIN XL) 150 MG 24 hr tablet, Take 150 mg by mouth., Disp: , Rfl:    cetirizine (ZYRTEC) 10 MG tablet, Take 10 mg by mouth daily as needed for allergies. , Disp: , Rfl:    clindamycin (CLINDAGEL) 1 % gel, APP TO ACNE  QHS, Disp: , Rfl: 2   ferrous sulfate  325 (65 FE) MG EC tablet, Take 1 tablet (325 mg total) by mouth daily with breakfast., Disp: ,  Rfl:    FOLIC ACID PO, Take by mouth., Disp: , Rfl:    loperamide (IMODIUM) 2 MG capsule, Take 2 mg by mouth daily as needed for diarrhea or loose stools., Disp: , Rfl:    METRONIDAZOLE, TOPICAL, 0.75 % LOTN, APP ONTO THE SKIN ONCE D UTD, Disp: , Rfl: 2   ondansetron  (ZOFRAN  ODT) 4 MG disintegrating tablet, Take 1 tablet (4 mg total) by mouth every 4 (four) hours as needed for nausea or vomiting., Disp: 20 tablet, Rfl: 0   pantoprazole  (PROTONIX ) 40 MG tablet, Take 40 mg by mouth at bedtime. , Disp: , Rfl:    topiramate (TOPAMAX) 50 MG tablet, Take 50 mg by mouth 2 (two) times daily., Disp: , Rfl:    Vilazodone  HCl 20 MG TABS, Take 1 tablet (20 mg total) by mouth daily after breakfast., Disp: 30 tablet, Rfl: 2   vitamin B-12 (CYANOCOBALAMIN ) 1000 MCG tablet, Take 500 mcg by mouth. , Disp: , Rfl:    Vitamin D , Ergocalciferol , (DRISDOL ) 50000 units CAPS capsule, TAKE ONE CAPSULE BY MOUTH 2 TIMES A WEEK, Disp: 24 capsule, Rfl: 0 Medication Side Effects: none  Family Medical/ Social History: Changes? No  MENTAL HEALTH EXAM:  There were no vitals taken for this visit.There is no height or weight on file to calculate BMI.  General Appearance: Casual, Neat, and Well Groomed  Eye Contact:  Good  Speech:  Clear and Coherent  Volume:  Normal  Mood:  NA  Affect:  Appropriate  Thought Process:  Coherent  Orientation:  Full (Time, Place, and Person)  Thought Content: Logical   Suicidal Thoughts:  No  Homicidal Thoughts:  No  Memory:  WNL  Judgement:  Good  Insight:  Good  Psychomotor Activity:  Normal  Concentration:  Concentration: Good  Recall:  Good  Fund of Knowledge: Good  Language: Good  Assets:  Desire for Improvement  ADL's:  Intact  Cognition: WNL  Prognosis:  Good    DIAGNOSES:    ICD-10-CM   1. Major depressive disorder, recurrent episode, moderate (HCC)  F33.1 Vilazodone  HCl (VIIBRYD ) 40 MG TABS    2. Generalized anxiety disorder  F41.1 Vilazodone  HCl (VIIBRYD ) 40 MG TABS       Receiving Psychotherapy: No    RECOMMENDATIONS:   Greater than 50% of 30  min face to face time with patient was spent on counseling and coordination of care. Discussed her recent significant improvement since starting the Viibryd . She has been happy with the medication but recently experiencing situational depression due to family and work dynamics. Reinforced discussion about her high risk for caregiver role strain.  Reinforced the importance of her taking time for self-care.  She is considering possible in vitro fertilization and becoming pregnant at some point but now has to have corrective surgery for newly discovered abdominal damage due to previous C-section.  We discussed risk factor with SSRIs.  She understands that she would need to notify her OB/GYN and this office immediately if becoming pregnant to discuss risk and safer medications.  Currently not on [facility] and having regular periods.   We agreed to:  To increase Viibryd   to 40 mg daily.  She has a history of bowel surgery and will need to cut tablet into smaller pieces.  It is not recommended that she crush tablet. Will report worsening symptoms or side effects promptly Will follow-up in 8 weeks to reassess Provided emergency contact information Reviewed PDMP    Lincoln Renshaw, NP

## 2024-04-22 DIAGNOSIS — J0100 Acute maxillary sinusitis, unspecified: Secondary | ICD-10-CM | POA: Diagnosis not present

## 2024-05-01 DIAGNOSIS — K91850 Pouchitis: Secondary | ICD-10-CM | POA: Diagnosis not present

## 2024-05-05 DIAGNOSIS — N85A Isthmocele: Secondary | ICD-10-CM | POA: Diagnosis not present

## 2024-05-14 ENCOUNTER — Other Ambulatory Visit: Payer: Self-pay | Admitting: Behavioral Health

## 2024-05-14 DIAGNOSIS — Z9889 Other specified postprocedural states: Secondary | ICD-10-CM | POA: Diagnosis not present

## 2024-05-14 DIAGNOSIS — Z9049 Acquired absence of other specified parts of digestive tract: Secondary | ICD-10-CM | POA: Diagnosis not present

## 2024-05-14 DIAGNOSIS — N85A Isthmocele: Secondary | ICD-10-CM | POA: Diagnosis not present

## 2024-05-14 DIAGNOSIS — R102 Pelvic and perineal pain: Secondary | ICD-10-CM | POA: Diagnosis not present

## 2024-05-14 DIAGNOSIS — N978 Female infertility of other origin: Secondary | ICD-10-CM | POA: Diagnosis not present

## 2024-05-14 DIAGNOSIS — Z98891 History of uterine scar from previous surgery: Secondary | ICD-10-CM | POA: Diagnosis not present

## 2024-05-14 DIAGNOSIS — Z79899 Other long term (current) drug therapy: Secondary | ICD-10-CM | POA: Diagnosis not present

## 2024-05-14 DIAGNOSIS — F411 Generalized anxiety disorder: Secondary | ICD-10-CM

## 2024-05-14 DIAGNOSIS — Z87891 Personal history of nicotine dependence: Secondary | ICD-10-CM | POA: Diagnosis not present

## 2024-05-14 DIAGNOSIS — O3422 Maternal care for cesarean scar defect (isthmocele): Secondary | ICD-10-CM | POA: Diagnosis not present

## 2024-05-14 DIAGNOSIS — F331 Major depressive disorder, recurrent, moderate: Secondary | ICD-10-CM

## 2024-05-14 DIAGNOSIS — E7800 Pure hypercholesterolemia, unspecified: Secondary | ICD-10-CM | POA: Diagnosis not present

## 2024-05-14 DIAGNOSIS — R7303 Prediabetes: Secondary | ICD-10-CM | POA: Diagnosis not present

## 2024-05-26 DIAGNOSIS — N85A Isthmocele: Secondary | ICD-10-CM | POA: Diagnosis not present

## 2024-06-05 DIAGNOSIS — Z833 Family history of diabetes mellitus: Secondary | ICD-10-CM | POA: Diagnosis not present

## 2024-06-05 DIAGNOSIS — Z6827 Body mass index (BMI) 27.0-27.9, adult: Secondary | ICD-10-CM | POA: Diagnosis not present

## 2024-06-05 DIAGNOSIS — E663 Overweight: Secondary | ICD-10-CM | POA: Diagnosis not present

## 2024-06-05 DIAGNOSIS — R21 Rash and other nonspecific skin eruption: Secondary | ICD-10-CM | POA: Diagnosis not present

## 2024-06-11 DIAGNOSIS — Z87891 Personal history of nicotine dependence: Secondary | ICD-10-CM | POA: Diagnosis not present

## 2024-06-11 DIAGNOSIS — K91850 Pouchitis: Secondary | ICD-10-CM | POA: Diagnosis not present

## 2024-06-11 DIAGNOSIS — Z79899 Other long term (current) drug therapy: Secondary | ICD-10-CM | POA: Diagnosis not present

## 2024-06-11 DIAGNOSIS — Z800 Family history of malignant neoplasm of digestive organs: Secondary | ICD-10-CM | POA: Diagnosis not present

## 2024-06-13 ENCOUNTER — Encounter: Payer: Self-pay | Admitting: Behavioral Health

## 2024-06-13 ENCOUNTER — Ambulatory Visit: Admitting: Behavioral Health

## 2024-06-13 DIAGNOSIS — G4709 Other insomnia: Secondary | ICD-10-CM

## 2024-06-13 DIAGNOSIS — F331 Major depressive disorder, recurrent, moderate: Secondary | ICD-10-CM | POA: Diagnosis not present

## 2024-06-13 DIAGNOSIS — F411 Generalized anxiety disorder: Secondary | ICD-10-CM

## 2024-06-13 MED ORDER — VILAZODONE HCL 40 MG PO TABS: 40.0000 mg | ORAL_TABLET | Freq: Every day | ORAL | 2 refills | Status: DC

## 2024-06-13 MED ORDER — LORAZEPAM 0.5 MG PO TABS
0.5000 mg | ORAL_TABLET | Freq: Three times a day (TID) | ORAL | 1 refills | Status: AC
Start: 2024-06-13 — End: ?

## 2024-06-13 NOTE — Progress Notes (Signed)
 [facility]  Patient ID: Kathleen Mcdonald,  MRN: [id number]  PCP: Frederik Charleston, MD  Date of Evaluation: 06/13/2024 Time spent:30 minutes  Chief Complaint:  Chief Complaint   Anxiety; Depression; Follow-up; Patient Education; Medication Refill; Medication Problem; Insomnia     HISTORY/CURRENT STATUS: HPI Kathleen Mcdonald, 43 year old female presents to this office for follow up and medication management.  Collateral information should be considered reliable. Says that mentally she feels much better. Feels like Viibryd  has significantly improved anxiety and depression allowing stressors to roll off a little bit better. She is beginning fertility treatments soon and understands that medications can pose risk prior to and during pregnancy. She also Reports problems with insomnia recently. Trouble falling and staying asleep. She is requesting PRN medication that is safe and may help. She has already attempted some OTC without success. She will be following up with fertility specialist and OB/GYN for further consult. Agrees to follow back up in 6 weeks for guidance. She reports anxiety today at 2/10, and depression at 2/10.  She is sleeping 7 to 8 hours per night.  She denies history of mania, no psychosis, no auditory or visual hallucinations.  Denies SI or HI.  Patient states that she feels safe and verbally contracts for safety with this Clinical research associate.   Past psychiatric medication trials: Wellbutrin Sertraline   Individual Medical History/ Review of Systems: Changes? :No   Allergies: Chocolate, Latex, and Nickel  Current Medications:  Current Outpatient Medications:    LORazepam (ATIVAN) 0.5 MG tablet, Take 1 tablet (0.5 mg total) by mouth every 8 (eight) hours., Disp: 30 tablet, Rfl: 1   b complex vitamins capsule, Take 1 capsule by mouth daily., Disp: 60 capsule, Rfl: 3   buPROPion (WELLBUTRIN XL) 150 MG 24 hr tablet, Take 150 mg by mouth., Disp: , Rfl:    cetirizine (ZYRTEC) 10  MG tablet, Take 10 mg by mouth daily as needed for allergies. , Disp: , Rfl:    clindamycin (CLINDAGEL) 1 % gel, APP TO ACNE  QHS, Disp: , Rfl: 2   ferrous sulfate  325 (65 FE) MG EC tablet, Take 1 tablet (325 mg total) by mouth daily with breakfast., Disp: , Rfl:    FOLIC ACID PO, Take by mouth., Disp: , Rfl:    loperamide (IMODIUM) 2 MG capsule, Take 2 mg by mouth daily as needed for diarrhea or loose stools., Disp: , Rfl:    METRONIDAZOLE, TOPICAL, 0.75 % LOTN, APP ONTO THE SKIN ONCE D UTD, Disp: , Rfl: 2   ondansetron  (ZOFRAN  ODT) 4 MG disintegrating tablet, Take 1 tablet (4 mg total) by mouth every 4 (four) hours as needed for nausea or vomiting., Disp: 20 tablet, Rfl: 0   pantoprazole  (PROTONIX ) 40 MG tablet, Take 40 mg by mouth at bedtime. , Disp: , Rfl:    topiramate (TOPAMAX) 50 MG tablet, Take 50 mg by mouth 2 (two) times daily., Disp: , Rfl:    Vilazodone  HCl (VIIBRYD ) 40 MG TABS, Take 1 tablet (40 mg total) by mouth daily., Disp: 30 tablet, Rfl: 2   Vilazodone  HCl 20 MG TABS, Take 1 tablet (20 mg total) by mouth daily after breakfast., Disp: 30 tablet, Rfl: 2   vitamin B-12 (CYANOCOBALAMIN ) 1000 MCG tablet, Take 500 mcg by mouth. , Disp: , Rfl:    Vitamin D , Ergocalciferol , (DRISDOL ) 50000 units CAPS capsule, TAKE ONE CAPSULE BY MOUTH 2 TIMES A WEEK, Disp: 24 capsule, Rfl: 0 Medication Side Effects: none  Family Medical/ Social History: Changes? No  MENTAL HEALTH EXAM:  There were no vitals taken for this visit.There is no height or weight on file to calculate BMI.  General Appearance: Casual, Neat, and Well Groomed  Eye Contact:  Good  Speech:  Clear and Coherent  Volume:  Normal  Mood:  NA  Affect:  Appropriate and Congruent  Thought Process:  Coherent  Orientation:  Full (Time, Place, and Person)  Thought Content: Logical   Suicidal Thoughts:  No  Homicidal Thoughts:  No  Memory:  WNL  Judgement:  Good  Insight:  Good  Psychomotor Activity:  Normal  Concentration:   Concentration: Good  Recall:  Good  Fund of Knowledge: Good  Language: Good  Assets:  Desire for Improvement  ADL's:  Intact  Cognition: WNL  Prognosis:  Good    DIAGNOSES:    ICD-10-CM   1. Other insomnia  G47.09 LORazepam (ATIVAN) 0.5 MG tablet    2. Major depressive disorder, recurrent episode, moderate (HCC)  F33.1 Vilazodone  HCl (VIIBRYD ) 40 MG TABS    3. Generalized anxiety disorder  F41.1 Vilazodone  HCl (VIIBRYD ) 40 MG TABS      Receiving Psychotherapy: No    RECOMMENDATIONS:  Greater than 50% of 30  min face to face time with patient was spent on counseling and coordination of care. Discussed her recent significant improvement since starting the Viibryd . She has been happy with the medication. We discussed her intermittent problems with sleep recently. Discussed safer medications during pregnancy. Agreed to f/u with specialist and make informed decisions about medication and POC.   Discussed her pending start to Invitro Fertilization tx.    We agreed to:   To continue Viibryd   to 40 mg daily.  She has a history of bowel surgery and will need to cut tablet into smaller pieces.  It is not recommended that she crush tablet. To start Ativan 0.5 mg as needed for sleep and anxiety at bedtime.  Will report worsening symptoms or side effects promptly Will follow-up in 6 weeks to reassess Provided emergency contact information Discussed potential benefits, risk, and side effects of benzodiazepines to include potential risk of tolerance and dependence, as well as possible drowsiness.  Advised patient not to drive if experiencing drowsiness and to take lowest possible effective dose to minimize risk of dependence and tolerance.  Reviewed PDMP       Redell DELENA Pizza, NP

## 2024-06-19 ENCOUNTER — Telehealth: Payer: Self-pay | Admitting: Behavioral Health

## 2024-06-19 NOTE — Telephone Encounter (Signed)
 Pt called to report she is getting ready to start the IVF process. She said her PCP, OB/GYN, and IVF team have told her she needed to stop the Viibryd  and lorazepam . IVF team wants her off the Viibryd  ASAP, recommend Zoloft or Wellbutrin in its place. She said she picked up the lorazepam  but has not taken any yet. She is taking 40 mg Viibryd , please advise how to wean.

## 2024-06-19 NOTE — Telephone Encounter (Signed)
 Need pill splitter. Reduce Viibryd  to 1/2 tablet  20 mg for 5 days, the quarter of tablet 10 mg for 5 days then stop.  Please pend Zoloft 50 mg tablet, 1/2 tablet for 7 days, the one whole tablet.  She should start Zoloft same time weaning from Viibryd 

## 2024-06-19 NOTE — Telephone Encounter (Signed)
 Patient lvm to inform you of her IVS process. Per her provider she will need some medication modification.

## 2024-06-20 MED ORDER — SERTRALINE HCL 50 MG PO TABS: ORAL_TABLET | ORAL | 0 refills | Status: AC

## 2024-06-20 NOTE — Telephone Encounter (Signed)
 Called patient back with Brian's response. She said she and husband have decided to go ahead and get a medication on board just in case. Rx for Zoloft 50 mg sent to [facility] on [address]

## 2024-06-20 NOTE — Telephone Encounter (Signed)
 When I called patient to review your recommendations she asked what you thought about her coming off all medication.  She feels stable enough currently and said she realizes that she doesn't know how she will do off medications.

## 2024-06-20 NOTE — Telephone Encounter (Signed)
 There is always risk of relapse and cannot guarantee anything. Its her decision. I will support what ever she wants to do. Without meds is always best during pregnancy if possible

## 2024-07-14 DIAGNOSIS — R92323 Mammographic fibroglandular density, bilateral breasts: Secondary | ICD-10-CM | POA: Diagnosis not present

## 2024-07-14 DIAGNOSIS — Z1231 Encounter for screening mammogram for malignant neoplasm of breast: Secondary | ICD-10-CM | POA: Diagnosis not present

## 2024-07-31 ENCOUNTER — Ambulatory Visit: Admitting: Behavioral Health

## 2024-08-13 DIAGNOSIS — N6001 Solitary cyst of right breast: Secondary | ICD-10-CM | POA: Diagnosis not present

## 2024-08-14 DIAGNOSIS — Z3169 Encounter for other general counseling and advice on procreation: Secondary | ICD-10-CM | POA: Diagnosis not present

## 2024-08-14 DIAGNOSIS — N8500 Endometrial hyperplasia, unspecified: Secondary | ICD-10-CM | POA: Diagnosis not present

## 2024-09-08 DIAGNOSIS — Z131 Encounter for screening for diabetes mellitus: Secondary | ICD-10-CM | POA: Diagnosis not present

## 2024-09-08 DIAGNOSIS — Z23 Encounter for immunization: Secondary | ICD-10-CM | POA: Diagnosis not present

## 2024-09-08 DIAGNOSIS — F32A Depression, unspecified: Secondary | ICD-10-CM | POA: Diagnosis not present

## 2024-09-08 DIAGNOSIS — E663 Overweight: Secondary | ICD-10-CM | POA: Diagnosis not present

## 2024-09-08 DIAGNOSIS — Z0000 Encounter for general adult medical examination without abnormal findings: Secondary | ICD-10-CM | POA: Diagnosis not present

## 2024-09-08 DIAGNOSIS — Z13220 Encounter for screening for lipoid disorders: Secondary | ICD-10-CM | POA: Diagnosis not present

## 2024-09-19 ENCOUNTER — Ambulatory Visit: Admitting: Behavioral Health

## 2024-10-03 DIAGNOSIS — L439 Lichen planus, unspecified: Secondary | ICD-10-CM | POA: Diagnosis not present

## 2024-10-03 DIAGNOSIS — L309 Dermatitis, unspecified: Secondary | ICD-10-CM | POA: Diagnosis not present

## 2024-10-29 ENCOUNTER — Ambulatory Visit: Admitting: Behavioral Health

## 2024-12-04 ENCOUNTER — Ambulatory Visit (INDEPENDENT_AMBULATORY_CARE_PROVIDER_SITE_OTHER): Admitting: Behavioral Health

## 2024-12-04 ENCOUNTER — Encounter: Payer: Self-pay | Admitting: Behavioral Health

## 2024-12-04 DIAGNOSIS — Z9189 Other specified personal risk factors, not elsewhere classified: Secondary | ICD-10-CM | POA: Diagnosis not present

## 2024-12-04 DIAGNOSIS — G4709 Other insomnia: Secondary | ICD-10-CM

## 2024-12-04 DIAGNOSIS — F411 Generalized anxiety disorder: Secondary | ICD-10-CM | POA: Diagnosis not present

## 2024-12-04 DIAGNOSIS — F331 Major depressive disorder, recurrent, moderate: Secondary | ICD-10-CM

## 2024-12-04 MED ORDER — VILAZODONE HCL 40 MG PO TABS: 40.0000 mg | ORAL_TABLET | Freq: Every day | ORAL | 2 refills | Status: AC

## 2024-12-04 NOTE — Progress Notes (Signed)
 "     [facility]  Patient ID: Kathleen Mcdonald,  MRN: [id number]  PCP: Kathleen Charleston, MD  Date of Evaluation: 12/04/2024 Time spent:30 minutes  Chief Complaint:  Chief Complaint   Anxiety; Family Problem; Depression; Medication Refill; Medication Problem; Follow-up; Patient Education     HISTORY/CURRENT STATUS: HPI Kathleen Mcdonald, 44 year old female presents to this office for follow up and medication management.  Collateral information should be considered reliable.  She says that she and her husband experienced another IVF failure. She appears distressed. Trying to make decision  on whether they will try again. She is experiencing more depression and anxiety. Finds herself more reactive and irritable. Says her husband recommended she come in today.  She reports anxiety today at 4/10, and depression at 5/10.  She is sleeping 7 to 8 hours per night.  She denies history of mania, no psychosis, no auditory or visual hallucinations.  Denies SI or HI.  Patient states that she feels safe and verbally contracts for safety with this clinical research associate.   Past psychiatric medication trials: Wellbutrin Sertraline      Individual Medical History/ Review of Systems: Changes? :No   Allergies: Chocolate, Latex, and Nickel  Current Medications: Current Medications[1] Medication Side Effects: none  Family Medical/ Social History: Changes? No  MENTAL HEALTH EXAM:  There were no vitals taken for this visit.There is no height or weight on file to calculate BMI.  General Appearance: Casual, Neat, and Well Groomed  Eye Contact:  Good  Speech:  Clear and Coherent  Volume:  Normal  Mood:  Anxious, Depressed, and Dysphoric  Affect:  Congruent, Depressed, Tearful, and Anxious  Thought Process:  Coherent  Orientation:  Full (Time, Place, and Person)  Thought Content: Logical   Suicidal Thoughts:  No  Homicidal Thoughts:  No  Memory:  WNL  Judgement:  Good  Insight:  Good  Psychomotor Activity:   Normal  Concentration:  Concentration: Good  Recall:  Good  Fund of Knowledge: Good  Language: Good  Assets:  Desire for Improvement  ADL's:  Intact  Cognition: WNL  Prognosis:  Good    DIAGNOSES:    ICD-10-CM   1. Major depressive disorder, recurrent episode, moderate (HCC)  F33.1 Vilazodone  HCl (VIIBRYD ) 40 MG TABS    2. Generalized anxiety disorder  F41.1 Vilazodone  HCl (VIIBRYD ) 40 MG TABS    3. At high risk for caregiver role strain  Z91.89     4. Other insomnia  G47.09       Receiving Psychotherapy: No    RECOMMENDATIONS:   Greater than 50% of 30  min face to face time with patient was spent on counseling and coordination of care. Very discouraged and distressed due to IVF failure. 4 total. She has questions about medications. We reviewed and discussed her concerns about pregnancy and risky medications. She understands these risk.  Will keep this office up to date with changes  or if she decides to try IVF again.    We agreed to:   To continue Viibryd   to 40 mg daily.  She has a history of bowel surgery and will need to cut tablet into smaller pieces.  It is not recommended that she crush tablet. To continue Ativan  0.5 mg as needed for sleep and anxiety at bedtime.  Will report worsening symptoms or side effects promptly Will follow-up in 6 weeks to reassess Provided emergency contact information Discussed potential benefits, risk, and side effects of benzodiazepines to include potential risk of tolerance and  dependence, as well as possible drowsiness.  Advised patient not to drive if experiencing drowsiness and to take lowest possible effective dose to minimize risk of dependence and tolerance.  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 1 prior successful pregnancy IVF.  Reviewed PDMP      [name] Pizza, NP     [1]  Current Outpatient Medications:    b complex vitamins capsule, Take 1 capsule by mouth daily., Disp: 60 capsule, Rfl:  3   buPROPion (WELLBUTRIN XL) 150 MG 24 hr tablet, Take 150 mg by mouth., Disp: , Rfl:    cetirizine (ZYRTEC) 10 MG tablet, Take 10 mg by mouth daily as needed for allergies. , Disp: , Rfl:    clindamycin (CLINDAGEL) 1 % gel, APP TO ACNE  QHS, Disp: , Rfl: 2   ferrous sulfate  325 (65 FE) MG EC tablet, Take 1 tablet (325 mg total) by mouth daily with breakfast., Disp: , Rfl:    FOLIC ACID PO, Take by mouth., Disp: , Rfl:    loperamide (IMODIUM) 2 MG capsule, Take 2 mg by mouth daily as needed for diarrhea or loose stools., Disp: , Rfl:    LORazepam  (ATIVAN ) 0.5 MG tablet, Take 1 tablet (0.5 mg total) by mouth every 8 (eight) hours., Disp: 30 tablet, Rfl: 1   METRONIDAZOLE, TOPICAL, 0.75 % LOTN, APP ONTO THE SKIN ONCE D UTD, Disp: , Rfl: 2   ondansetron  (ZOFRAN  ODT) 4 MG disintegrating tablet, Take 1 tablet (4 mg total) by mouth every 4 (four) hours as needed for nausea or vomiting., Disp: 20 tablet, Rfl: 0   pantoprazole  (PROTONIX ) 40 MG tablet, Take 40 mg by mouth at bedtime. , Disp: , Rfl:    sertraline  (ZOLOFT ) 50 MG tablet, Take 1/2 tablet for 7 days and then increase to 1 tablet every day., Disp: 30 tablet, Rfl: 0   topiramate (TOPAMAX) 50 MG tablet, Take 50 mg by mouth 2 (two) times daily., Disp: , Rfl:    Vilazodone  HCl (VIIBRYD ) 40 MG TABS, Take 1 tablet (40 mg total) by mouth daily., Disp: 30 tablet, Rfl: 2   Vilazodone  HCl 20 MG TABS, Take 1 tablet (20 mg total) by mouth daily after breakfast., Disp: 30 tablet, Rfl: 2   vitamin B-12 (CYANOCOBALAMIN ) 1000 MCG tablet, Take 500 mcg by mouth. , Disp: , Rfl:    Vitamin D , Ergocalciferol , (DRISDOL ) 50000 units CAPS capsule, TAKE ONE CAPSULE BY MOUTH 2 TIMES A WEEK, Disp: 24 capsule, Rfl: 0  "

## 2024-12-15 ENCOUNTER — Telehealth: Payer: Self-pay | Admitting: Behavioral Health

## 2024-12-15 NOTE — Telephone Encounter (Signed)
 LVM to [facility]

## 2024-12-15 NOTE — Telephone Encounter (Signed)
 Pt lvm stating, having side effects from going back on previous med. Face twitching from eye down. Contact # [phone]   next apt 2/20

## 2024-12-16 NOTE — Telephone Encounter (Signed)
 Reviewed recommendations with patient.

## 2024-12-16 NOTE — Telephone Encounter (Signed)
 Pt reports eye twitching, extending down one side of her face. She said she had been on Viibryd  and stopped it for IVF, but restarted 20 mg on 1/8. Reports that probably 2 days later sx started and seem to be getting worse. Since she just reported one side I questioned her about possible Bell's palsy sx and she denies usual sx. She is reporting her family has COVID currently. She does work on a animator, so potentially some eye strain as well. She does report benefit. She does describe some eye watering, but could be related to COVID. She said her son does ask her what is wrong with her eye.

## 2024-12-16 NOTE — Telephone Encounter (Signed)
 Not convinced its from the medication, but it does not sound serious. I would give it another week to see if it subsides since she has been on the medication before without these problems.

## 2025-01-16 ENCOUNTER — Ambulatory Visit: Admitting: Behavioral Health
